# Patient Record
Sex: Female | Born: 1945 | Race: White | Hispanic: No | Marital: Married | Smoking: Former smoker
Health system: Southern US, Community
[De-identification: ages and names within clinical notes are randomized; demographics above are authoritative.]

## PROBLEM LIST (undated history)

## (undated) DIAGNOSIS — E119 Type 2 diabetes mellitus without complications: Secondary | ICD-10-CM

## (undated) DIAGNOSIS — J40 Bronchitis, not specified as acute or chronic: Secondary | ICD-10-CM

## (undated) DIAGNOSIS — I10 Essential (primary) hypertension: Secondary | ICD-10-CM

## (undated) DIAGNOSIS — E785 Hyperlipidemia, unspecified: Secondary | ICD-10-CM

## (undated) DIAGNOSIS — C50919 Malignant neoplasm of unspecified site of unspecified female breast: Secondary | ICD-10-CM

## (undated) HISTORY — DX: Type 2 diabetes mellitus without complications: E11.9

## (undated) HISTORY — DX: Essential (primary) hypertension: I10

## (undated) HISTORY — DX: Malignant neoplasm of unspecified site of unspecified female breast: C50.919

## (undated) HISTORY — PX: ABDOMINAL HYSTERECTOMY: SHX81

## (undated) HISTORY — DX: Bronchitis, not specified as acute or chronic: J40

## (undated) HISTORY — DX: Hyperlipidemia, unspecified: E78.5

## (undated) HISTORY — PX: BREAST SURGERY: SHX581

---

## 1996-12-30 DIAGNOSIS — C50919 Malignant neoplasm of unspecified site of unspecified female breast: Secondary | ICD-10-CM

## 1996-12-30 HISTORY — DX: Malignant neoplasm of unspecified site of unspecified female breast: C50.919

## 2002-03-10 ENCOUNTER — Ambulatory Visit (HOSPITAL_COMMUNITY): Admission: RE | Admit: 2002-03-10 | Discharge: 2002-03-10 | Payer: Self-pay | Admitting: Family Medicine

## 2002-03-10 ENCOUNTER — Encounter: Payer: Self-pay | Admitting: Family Medicine

## 2002-12-30 HISTORY — PX: COLONOSCOPY: SHX174

## 2003-03-15 ENCOUNTER — Encounter: Payer: Self-pay | Admitting: *Deleted

## 2003-03-15 ENCOUNTER — Ambulatory Visit (HOSPITAL_COMMUNITY): Admission: RE | Admit: 2003-03-15 | Discharge: 2003-03-15 | Payer: Self-pay | Admitting: *Deleted

## 2003-04-04 ENCOUNTER — Ambulatory Visit (HOSPITAL_COMMUNITY): Admission: RE | Admit: 2003-04-04 | Discharge: 2003-04-04 | Payer: Self-pay | Admitting: Internal Medicine

## 2003-12-31 HISTORY — PX: COLON BIOPSY: SHX1369

## 2004-03-21 ENCOUNTER — Ambulatory Visit (HOSPITAL_COMMUNITY): Admission: RE | Admit: 2004-03-21 | Discharge: 2004-03-21 | Payer: Self-pay | Admitting: Family Medicine

## 2004-05-07 ENCOUNTER — Ambulatory Visit (HOSPITAL_COMMUNITY): Admission: RE | Admit: 2004-05-07 | Discharge: 2004-05-07 | Payer: Self-pay | Admitting: Internal Medicine

## 2005-03-25 ENCOUNTER — Ambulatory Visit (HOSPITAL_COMMUNITY): Admission: RE | Admit: 2005-03-25 | Discharge: 2005-03-25 | Payer: Self-pay | Admitting: Family Medicine

## 2006-03-27 ENCOUNTER — Ambulatory Visit (HOSPITAL_COMMUNITY): Admission: RE | Admit: 2006-03-27 | Discharge: 2006-03-27 | Payer: Self-pay | Admitting: Family Medicine

## 2007-03-31 ENCOUNTER — Ambulatory Visit (HOSPITAL_COMMUNITY): Admission: RE | Admit: 2007-03-31 | Discharge: 2007-03-31 | Payer: Self-pay | Admitting: Family Medicine

## 2008-04-05 ENCOUNTER — Ambulatory Visit (HOSPITAL_COMMUNITY): Admission: RE | Admit: 2008-04-05 | Discharge: 2008-04-05 | Payer: Self-pay | Admitting: Family Medicine

## 2009-04-07 ENCOUNTER — Ambulatory Visit (HOSPITAL_COMMUNITY): Admission: RE | Admit: 2009-04-07 | Discharge: 2009-04-07 | Payer: Self-pay | Admitting: Internal Medicine

## 2009-10-02 ENCOUNTER — Ambulatory Visit (HOSPITAL_COMMUNITY): Admission: RE | Admit: 2009-10-02 | Discharge: 2009-10-02 | Payer: Self-pay | Admitting: Family Medicine

## 2010-04-09 ENCOUNTER — Ambulatory Visit (HOSPITAL_COMMUNITY): Admission: RE | Admit: 2010-04-09 | Discharge: 2010-04-09 | Payer: Self-pay | Admitting: Family Medicine

## 2011-03-04 ENCOUNTER — Other Ambulatory Visit (HOSPITAL_COMMUNITY): Payer: Self-pay | Admitting: Internal Medicine

## 2011-03-04 DIAGNOSIS — Z139 Encounter for screening, unspecified: Secondary | ICD-10-CM

## 2011-04-12 ENCOUNTER — Ambulatory Visit (HOSPITAL_COMMUNITY)
Admission: RE | Admit: 2011-04-12 | Discharge: 2011-04-12 | Disposition: A | Discharge status: Home / Self Care | Payer: Medicare Other | Source: Ambulatory Visit | Attending: Internal Medicine | Admitting: Internal Medicine

## 2011-04-12 DIAGNOSIS — Z139 Encounter for screening, unspecified: Secondary | ICD-10-CM

## 2011-04-12 DIAGNOSIS — Z1231 Encounter for screening mammogram for malignant neoplasm of breast: Secondary | ICD-10-CM | POA: Insufficient documentation

## 2011-04-15 ENCOUNTER — Other Ambulatory Visit: Payer: Self-pay | Admitting: Internal Medicine

## 2011-04-15 DIAGNOSIS — R928 Other abnormal and inconclusive findings on diagnostic imaging of breast: Secondary | ICD-10-CM

## 2011-04-17 ENCOUNTER — Ambulatory Visit (HOSPITAL_COMMUNITY)
Admission: RE | Admit: 2011-04-17 | Discharge: 2011-04-17 | Disposition: A | Discharge status: Home / Self Care | Payer: Medicare Other | Source: Ambulatory Visit | Attending: Internal Medicine | Admitting: Internal Medicine

## 2011-04-17 ENCOUNTER — Other Ambulatory Visit: Payer: Self-pay | Admitting: Internal Medicine

## 2011-04-17 DIAGNOSIS — R928 Other abnormal and inconclusive findings on diagnostic imaging of breast: Secondary | ICD-10-CM | POA: Insufficient documentation

## 2011-05-17 NOTE — Op Note (Signed)
NAME:  Kelly Gay, Kelly Gay                        ACCOUNT NO.:  [id number]   MEDICAL RECORD NO.:  [id number]                   PATIENT TYPE:  AMB   LOCATION:  DAY                                  FACILITY:  [facility]   PHYSICIAN:  R. Roetta Sessions, M.D.              DATE OF BIRTH:  [birth year]   DATE OF PROCEDURE:  DATE OF DISCHARGE:                                 OPERATIVE REPORT   PROCEDURE:  Screening colonoscopy.   ENDOSCOPIST:  Gerrit Friends. Rourk, M.D.   INDICATIONS FOR PROCEDURE:  The patient is a 65 year old lady devoid of any  lower GI tract symptoms. She was seen at the courtesy of [facility] for screening colonoscopy.  No family history of  colorectal neoplasia.  She has never had her lower GI tract imaged.  Please  see my handwritten H&P for more information.   MONITORING:  O2 saturation, blood pressure, pulse and respirations were  monitored throughout the entire procedure.  Conscious sedation: Versed 4 mg IV, Demerol 75 mg IV in divided doses.   INSTRUMENT:  Olympus video chip adult colonoscope.   FINDINGS:  Digital rectal exam revealed no abnormalities.   ENDOSCOPIC FINDINGS:  The prep was good.   RECTUM:  Examination of the rectal mucosa including the retroflex view of  the anal verge revealed a 5-mm polyp 3 cm from the anal verge.  The  remainder of the rectum appeared normal.   COLON:  The colonic mucosa was surveyed from the rectosigmoid junction  through the left transverse and right colon to the area of the appendiceal  orifice, ileocecal valve, and cecum.  These structures were well seen and  photographed for the record.  The patient was noted to have multiple small  diminutive polyps at the rectosigmoid which was destroyed with the tip of  the snare cautery unit.  At 45 cm there was a 5 mm polyp.  The remainder of  the colonic mucosa to the cecum appeared normal.   From the level of the cecum and ileocecal valve the scope was  slowly and  cautiously withdrawn.  All previously mentioned mucosal surfaces were again  seen; no abnormalities were observed.  The polyp at 45 cm was removed with  the snare.  Likewise the polyp in the rectum was also removed with the  snare.  The diminutive polyps of the rectosigmoid were destroyed with the  tip of the snare on the way out.   The patient tolerated the procedure well and was reacted in endoscopy.   IMPRESSION:  1. Left colon polys as described above, treated and/or removed as described     above.  2. The remainder of the rectum and colon appeared normal.   RECOMMENDATIONS:  1. No aspirin or arthritis medications for 10 days.  2. Follow up on path.  3. Further recommendations to follow.  Jonathon Bellows, M.D.    RMR/MEDQ  D:  04/04/2003  T:  04/04/2003  Job:  [id number]   cc:   [facility]

## 2011-05-17 NOTE — Op Note (Signed)
NAME:  Kelly Gay, Kelly Gay                        ACCOUNT NO.:  [id number]   MEDICAL RECORD NO.:  [id number]                   PATIENT TYPE:  AMB   LOCATION:  DAY                                  FACILITY:  [facility]   PHYSICIAN:  R. Roetta Sessions, M.D.              DATE OF BIRTH:  11-09-1946   DATE OF PROCEDURE:  05/07/2004  DATE OF DISCHARGE:                                 OPERATIVE REPORT   PROCEDURE:  Surveillance colonoscopy.   INDICATIONS FOR PROCEDURE:  The patient is a 65 year old lady who underwent  a colonoscopy one year ago.  She had polyps in the rectum and sigmoid.  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  The rectal  polyp was inflammatory.  She is here for early surveillance.  She is devoid  of any lower GI tract symptoms.  She has really done well.  Colonoscopy has  been discussed with the patient at length.  The potential risks, benefits,  and alternatives have been reviewed.  She is agreeable.  Please see my  handwritten H&P for more information.   PROCEDURE:  O2 saturation, blood pressure, pulses, and respirations were  monitored throughout the entirety of the procedure.  Conscious sedation was  with Versed 5 mg IV, Demerol 100 mg IV in divided doses.  The instrument  used was the Olympus video chip system.   FINDINGS:  Digital rectal examination revealed no abnormalities.   ENDOSCOPIC FINDINGS:  The prep was good.   Rectum:  Examination of the rectal mucosa including retroflex view of the  anal verge revealed only minimal internal hemorrhoids.   Colon:  The colonic mucosa was surveyed from the rectosigmoid junction  through the left, transverse, right colon to the area of the appendiceal  orifice, ileocecal valve, and cecum.  These structures were well-seen and  photographed for the record.  From this level, the scope was slowly  withdrawn.  All previously mentioned mucosal surfaces were again seen.  The  colonic mucosa appeared normal.  The  patient tolerated the procedure well  and was reactive in endoscopy.   IMPRESSION:  1. Internal hemorrhoids.  Otherwise normal rectum  2. Normal colon.   RECOMMENDATIONS:  Repeat colonoscopy in three years.      ___________________________________________                                            Jonathon Bellows, M.D.   RMR/MEDQ  D:  05/07/2004  T:  05/07/2004  Job:  [id number]   cc:   Alfonso Patten, P.A.C.  [facility]  [place], [place]

## 2011-09-10 ENCOUNTER — Other Ambulatory Visit (HOSPITAL_COMMUNITY): Payer: Self-pay | Admitting: Family Medicine

## 2011-09-10 DIAGNOSIS — R922 Inconclusive mammogram: Secondary | ICD-10-CM

## 2011-10-23 ENCOUNTER — Encounter (HOSPITAL_COMMUNITY): Payer: Medicare Other

## 2011-10-30 ENCOUNTER — Ambulatory Visit (HOSPITAL_COMMUNITY)
Admission: RE | Admit: 2011-10-30 | Discharge: 2011-10-30 | Disposition: A | Discharge status: Home / Self Care | Payer: Medicare Other | Source: Ambulatory Visit | Attending: Family Medicine | Admitting: Family Medicine

## 2011-10-30 DIAGNOSIS — R922 Inconclusive mammogram: Secondary | ICD-10-CM

## 2011-10-30 DIAGNOSIS — N6489 Other specified disorders of breast: Secondary | ICD-10-CM | POA: Insufficient documentation

## 2012-03-24 ENCOUNTER — Other Ambulatory Visit (HOSPITAL_COMMUNITY): Payer: Self-pay | Admitting: Family Medicine

## 2012-03-24 DIAGNOSIS — C50919 Malignant neoplasm of unspecified site of unspecified female breast: Secondary | ICD-10-CM

## 2012-03-24 DIAGNOSIS — Z139 Encounter for screening, unspecified: Secondary | ICD-10-CM

## 2012-04-14 ENCOUNTER — Ambulatory Visit (HOSPITAL_COMMUNITY)
Admission: RE | Admit: 2012-04-14 | Discharge: 2012-04-14 | Disposition: A | Discharge status: Home / Self Care | Payer: Medicare Other | Source: Ambulatory Visit | Attending: Family Medicine | Admitting: Family Medicine

## 2012-04-14 DIAGNOSIS — Z1231 Encounter for screening mammogram for malignant neoplasm of breast: Secondary | ICD-10-CM | POA: Insufficient documentation

## 2012-04-14 DIAGNOSIS — Z139 Encounter for screening, unspecified: Secondary | ICD-10-CM

## 2012-04-15 ENCOUNTER — Encounter (HOSPITAL_COMMUNITY): Payer: Medicare Other

## 2013-03-15 ENCOUNTER — Other Ambulatory Visit (HOSPITAL_COMMUNITY): Payer: Self-pay | Admitting: Family Medicine

## 2013-03-15 DIAGNOSIS — Z139 Encounter for screening, unspecified: Secondary | ICD-10-CM

## 2013-04-06 ENCOUNTER — Emergency Department (HOSPITAL_COMMUNITY): Payer: Medicare Other

## 2013-04-06 ENCOUNTER — Encounter (HOSPITAL_COMMUNITY): Payer: Self-pay | Admitting: *Deleted

## 2013-04-06 ENCOUNTER — Emergency Department (HOSPITAL_COMMUNITY)
Admission: EM | Admit: 2013-04-06 | Discharge: 2013-04-06 | Disposition: A | Discharge status: Home / Self Care | Payer: Medicare Other | Attending: Emergency Medicine | Admitting: Emergency Medicine

## 2013-04-06 DIAGNOSIS — Y9289 Other specified places as the place of occurrence of the external cause: Secondary | ICD-10-CM | POA: Insufficient documentation

## 2013-04-06 DIAGNOSIS — S0990XA Unspecified injury of head, initial encounter: Secondary | ICD-10-CM | POA: Insufficient documentation

## 2013-04-06 DIAGNOSIS — S42201A Unspecified fracture of upper end of right humerus, initial encounter for closed fracture: Secondary | ICD-10-CM

## 2013-04-06 DIAGNOSIS — S59909A Unspecified injury of unspecified elbow, initial encounter: Secondary | ICD-10-CM | POA: Insufficient documentation

## 2013-04-06 DIAGNOSIS — W010XXA Fall on same level from slipping, tripping and stumbling without subsequent striking against object, initial encounter: Secondary | ICD-10-CM | POA: Insufficient documentation

## 2013-04-06 DIAGNOSIS — F17200 Nicotine dependence, unspecified, uncomplicated: Secondary | ICD-10-CM | POA: Insufficient documentation

## 2013-04-06 DIAGNOSIS — S6990XA Unspecified injury of unspecified wrist, hand and finger(s), initial encounter: Secondary | ICD-10-CM | POA: Insufficient documentation

## 2013-04-06 DIAGNOSIS — S42209A Unspecified fracture of upper end of unspecified humerus, initial encounter for closed fracture: Secondary | ICD-10-CM | POA: Insufficient documentation

## 2013-04-06 DIAGNOSIS — S59919A Unspecified injury of unspecified forearm, initial encounter: Secondary | ICD-10-CM | POA: Insufficient documentation

## 2013-04-06 DIAGNOSIS — Y9389 Activity, other specified: Secondary | ICD-10-CM | POA: Insufficient documentation

## 2013-04-06 MED ORDER — ONDANSETRON 8 MG PO TBDP
8.0000 mg | ORAL_TABLET | Freq: Once | ORAL | Status: AC
  Administered 2013-04-06: 8 mg via ORAL
  Filled 2013-04-06: qty 1

## 2013-04-06 MED ORDER — OXYCODONE-ACETAMINOPHEN 5-325 MG PO TABS: 1.0000 | ORAL_TABLET | ORAL | Status: DC | PRN

## 2013-04-06 MED ORDER — MORPHINE SULFATE 4 MG/ML IJ SOLN
4.0000 mg | Freq: Once | INTRAMUSCULAR | Status: AC
  Administered 2013-04-06: 4 mg via INTRAMUSCULAR
  Filled 2013-04-06: qty 1

## 2013-04-06 NOTE — ED Provider Notes (Signed)
History  This chart was scribed for Joya Gaskins, MD, by Candelaria Stagers, ED Scribe. This patient was seen in room [facility] and the patient's care was started at 5:51 PM   CSN: [id number]  Arrival date & time 04/06/13  1637   First MD Initiated Contact with Patient 04/06/13 1700      Chief Complaint  Patient presents with  . Fall    The history is provided by the patient. No language interpreter was used.   Kelly Gay is a 67 y.o. female who presents to the Emergency Department complaining of right shoulder pain and right elbow pain after tripping on a dog leash and falling backwards landing on her right shoulder earlier today.  Pt reports she hit her head but denies LOC.  She denies neck pain, back pain, headache, chest pain, or abdominal pain.  Nothing seems to make the sx better or worse.  Pt was ambulatory after the fall.  Pt has no previous injury or surgeries to the right arm.         PMH - none  Past Surgical History  Procedure Laterality Date  . Upper gastrointestinal endoscopy    . Abdominal hysterectomy    . Breast surgery      History reviewed. No pertinent family history.  History  Substance Use Topics  . Smoking status: Current Every Day Smoker  . Smokeless tobacco: Not on file  . Alcohol Use: No    OB History   Grav Para Term Preterm Abortions TAB SAB Ect Mult Living                  Review of Systems  HENT: Negative for neck pain.   Musculoskeletal: Positive for arthralgias (right shoulder and elbow pain). Negative for back pain.  Neurological: Negative for syncope.  All other systems reviewed and are negative.    Allergies  Review of patient's allergies indicates no active allergies.  Home Medications  No current outpatient prescriptions on file.  BP 151/70  Pulse 90  Temp(Src) 98.1 F (36.7 C) (Oral)  Resp 22  Ht 5\' 5"  (1.651 m)  Wt 119 lb (53.978 kg)  BMI 19.8 kg/m2  SpO2 99%  Physical Exam CONSTITUTIONAL: Well  developed/well nourished HEAD: Normocephalic/atraumatic EYES: EOMI/PERRL ENMT: Mucous membranes moist, No evidence of facial/nasal trauma NECK: supple no meningeal signs SPINE:entire spine nontender, No bruising/crepitance/stepoffs noted to spine CV: S1/S2 noted, no murmurs/rubs/gallops noted LUNGS: Lungs are clear to auscultation bilaterally, no apparent distress ABDOMEN: soft, nontender, no rebound or guarding GU:no cva tenderness NEURO: Pt is awake/alert, moves all extremitiesx4 EXTREMITIES: pulses normal, Tender to palpation of right elbow and right shoulder, abrasion to the right elbow but no laceration noted and no bone exposed.  No tenderness to right wrist/hand. She can range right elbow. Distal motor is intact on right UE.  Distal pulses are intact on right UE All other extremities/joints palpated/ranged and nontender SKIN: warm, color normal PSYCH: no abnormalities of mood noted  ED Course  Procedures   DIAGNOSTIC STUDIES: Oxygen Saturation is 99% on room air, normal by my interpretation.    COORDINATION OF CARE:  5:57 PM Discussed course of care with pt which includes right arm immobilizer, pain medication, and follow up with orthopaedist.  Pt understands and agrees.  Pt stable for d/c and will need to call for f/u with ortho tomorrow  Labs Reviewed - No data to display Dg Shoulder Right  04/06/2013  *RADIOLOGY REPORT*  Clinical Data: Fall.  Right shoulder pain.  RIGHT SHOULDER - 2+ VIEW  Comparison: Right humerus  Findings: There is a comminuted fracture of the right humeral head and neck.  The glenohumeral joint is located. No additional fractures are present.  The right hemithorax is clear.  IMPRESSION: Comminuted fracture of the right humeral head and neck without dislocation.   Original Report Authenticated By: Marin Roberts, M.D.    Dg Humerus Right  04/06/2013  *RADIOLOGY REPORT*  Clinical Data: Fall.  The right shoulder pain.  RIGHT HUMERUS - 2+ VIEW   Comparison: Shoulder films of the same day.  Findings: A comminuted right humeral head and surgical neck fracture is foreshortened.  The glenohumeral joint is intact.  The distal humerus is unremarkable.  IMPRESSION:  1.  Comminuted right humeral head and neck fracture without dislocation.   Original Report Authenticated By: Marin Roberts, M.D.        MDM  Nursing notes including past medical history and social history reviewed and considered in documentation xrays reviewed and considered      I personally performed the services described in this documentation, which was scribed in my presence. The recorded information has been reviewed and is accurate.         Joya Gaskins, MD 04/06/13 1901

## 2013-04-06 NOTE — ED Notes (Addendum)
Larey Seat when caught between neighbors dog  And chain, fell backwards and injured rt upper arm.  Good radial pulse, pt is attempting to remove her rings

## 2013-04-07 ENCOUNTER — Telehealth: Payer: Self-pay | Admitting: Orthopedic Surgery

## 2013-04-07 NOTE — Telephone Encounter (Signed)
Schedule when we have an appt time

## 2013-04-07 NOTE — Telephone Encounter (Signed)
Please review xr and report for Kelly Gay right shoulder fracture, went to [facility] ER 04/06/13 and advise when to schedule.  Thanks

## 2013-04-07 NOTE — Telephone Encounter (Signed)
Spoke with patient, she has an appointment with  Dr. Hilda Lias Thursday, 04/08/13.

## 2013-04-19 ENCOUNTER — Ambulatory Visit (HOSPITAL_COMMUNITY): Payer: Medicare Other

## 2013-05-21 ENCOUNTER — Other Ambulatory Visit: Payer: Self-pay | Admitting: Orthopedic Surgery

## 2013-05-21 ENCOUNTER — Ambulatory Visit
Admission: RE | Admit: 2013-05-21 | Discharge: 2013-05-21 | Disposition: A | Discharge status: Home / Self Care | Payer: Medicare Other | Source: Ambulatory Visit | Attending: Orthopedic Surgery | Admitting: Orthopedic Surgery

## 2013-05-21 DIAGNOSIS — M25511 Pain in right shoulder: Secondary | ICD-10-CM

## 2013-09-02 ENCOUNTER — Ambulatory Visit (HOSPITAL_COMMUNITY): Payer: Medicare Other

## 2013-09-03 ENCOUNTER — Ambulatory Visit (HOSPITAL_COMMUNITY)
Admission: RE | Admit: 2013-09-03 | Discharge: 2013-09-03 | Disposition: A | Discharge status: Home / Self Care | Payer: Medicare Other | Source: Ambulatory Visit | Attending: Family Medicine | Admitting: Family Medicine

## 2013-09-03 DIAGNOSIS — Z139 Encounter for screening, unspecified: Secondary | ICD-10-CM

## 2013-09-03 DIAGNOSIS — Z1231 Encounter for screening mammogram for malignant neoplasm of breast: Secondary | ICD-10-CM | POA: Insufficient documentation

## 2014-05-24 ENCOUNTER — Telehealth: Payer: Self-pay

## 2014-05-24 NOTE — Telephone Encounter (Signed)
Pt was referred by Chip Boer, PA for screening colonoscopy. She was last done according to our records on 05/07/2004 and Dr. Gala Romney recommended the next in 3 years.   She is scheduled with Neil Crouch, PA on 06/22/2014 at 10:30 AM for OV.

## 2014-06-22 ENCOUNTER — Encounter: Payer: Self-pay | Admitting: Gastroenterology

## 2014-06-22 ENCOUNTER — Ambulatory Visit (INDEPENDENT_AMBULATORY_CARE_PROVIDER_SITE_OTHER): Payer: Medicare Other | Admitting: Gastroenterology

## 2014-06-22 ENCOUNTER — Encounter (INDEPENDENT_AMBULATORY_CARE_PROVIDER_SITE_OTHER): Payer: Self-pay

## 2014-06-22 VITALS — BP 134/75 | HR 90 | Temp 97.9°F | Resp 18 | Ht 64.0 in | Wt 121.0 lb

## 2014-06-22 DIAGNOSIS — D649 Anemia, unspecified: Secondary | ICD-10-CM

## 2014-06-22 DIAGNOSIS — K219 Gastro-esophageal reflux disease without esophagitis: Secondary | ICD-10-CM

## 2014-06-22 DIAGNOSIS — Z86004 Personal history of in-situ neoplasm of other and unspecified digestive organs: Secondary | ICD-10-CM | POA: Insufficient documentation

## 2014-06-22 DIAGNOSIS — Z87898 Personal history of other specified conditions: Secondary | ICD-10-CM

## 2014-06-22 MED ORDER — PEG 3350-KCL-NA BICARB-NACL 420 G PO SOLR: 4000.0000 mL | ORAL | Status: DC

## 2014-06-22 NOTE — Assessment & Plan Note (Addendum)
68 y/o female with normocytic anemia, hemoccult status unknown. 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 She is well-overdue for colonoscopy. She has chronic GERD without prior EGD. Recommend TCS/EGD to evaluate anemia, rule out complicated GERD, h/o carcinoma in situ of sigmoid colon.  I have discussed the risks, alternatives, benefits with regards to but not limited to the risk of reaction to medication, bleeding, infection, perforation and the patient is agreeable to proceed. Written consent to be obtained.  Based on her history, her children should have their first colonoscopies at age 67.

## 2014-06-22 NOTE — Patient Instructions (Signed)
1. Colonoscopy and upper endoscopy with Dr. Gala Romney. See separate instructions.

## 2014-06-22 NOTE — Progress Notes (Signed)
Primary Care Physician:  Geroge Baseman  Primary Gastroenterologist:  Garfield Cornea, MD   Chief Complaint  Patient presents with  . Colon Cancer Screening    HPI:  Kelly Gay is a 68 y.o. female here at the request of Elin Claggett, PA-C for further evaluation of anemia/colonoscopy. 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 Last colonoscopy in 2003, normal. Recently labs showed Hgb of 10.8. Started empirically on MVI with iron. Hemoccult status unknown. BM 1-2 solid stool. No melena, brbpr. No abdominal pain. Lots of noise in the stool. Typical heartburn well-controlled on Prilosec. On PPI for years. Patient has good appetite but does not eat a lot at one time. No prior EGD per patient.  Current Outpatient Prescriptions  Medication Sig Dispense Refill  . citalopram (CELEXA) 10 MG tablet Take 10 mg by mouth daily.      . fenofibrate 160 MG tablet Take 160 mg by mouth daily.      Marland Kitchen glipiZIDE (GLUCOTROL) 10 MG tablet Take 10 mg by mouth 2 (two) times daily.      . hydrochlorothiazide (HYDRODIURIL) 12.5 MG tablet Take 12.5 mg by mouth daily.      Marland Kitchen loratadine (CLARITIN) 10 MG tablet Take 10 mg by mouth daily.      Marland Kitchen losartan (COZAAR) 100 MG tablet Take 100 mg by mouth daily.      Marland Kitchen METFORMIN HCL PO Take 1 tablet by mouth 2 (two) times daily.      . Multiple Vitamins-Minerals (CENTRUM ULTRA WOMENS) TABS Take by mouth daily.      Marland Kitchen omeprazole (PRILOSEC) 10 MG capsule Take 10 mg by mouth daily.      Marland Kitchen QVAR 40 MCG/ACT inhaler        No current facility-administered medications for this visit.    Allergies as of 06/22/2014  . (No Known Allergies)    Past Medical History  Diagnosis Date  . Diabetes   . HTN (hypertension)   . Hyperlipidemia   . Breast cancer     1998, right   . Bronchitis     Past Surgical History  Procedure Laterality Date  . Abdominal hysterectomy    . Breast surgery      partial, right for cancer  . Colonoscopy   2004    Dr. Gala Romney: multiple rectosigmoid polyps, sigmoid polyp with carcinoma in situ  . Colon biopsy  2005     Dr. Gala Romney: normal    Family History  Problem Relation Age of Onset  . Colon cancer Neg Hx   . Throat cancer Brother   . Lung cancer Brother   . Cancer Brother     ?prostate    History   Social History  . Marital Status: Married    Spouse Name: N/A    Number of Children: 2  . Years of Education: N/A   Occupational History  . Not on file.   Social History Main Topics  . Smoking status: Current Every Day Smoker  . Smokeless tobacco: Not on file  . Alcohol Use: No  . Drug Use: No  . Sexual Activity: Yes    Birth Control/ Protection: Surgical   Other Topics Concern  . Not on file   Social History Narrative  . No narrative on file      ROS:  General: Negative for anorexia, weight loss, fever, chills, fatigue, weakness. Eyes: Negative for vision changes.  ENT: Negative for hoarseness, difficulty swallowing , nasal  congestion. CV: Negative for chest pain, angina, palpitations, dyspnea on exertion, peripheral edema.  Respiratory: Negative for dyspnea at rest, dyspnea on exertion, cough, sputum, wheezing.  GI: See history of present illness. GU:  Negative for dysuria, hematuria, urinary incontinence, urinary frequency, nocturnal urination.  MS: Negative for joint pain, low back pain.  Derm: Negative for rash or itching.  Neuro: Negative for weakness, abnormal sensation, seizure, frequent headaches, memory loss, confusion.  Psych: Negative for anxiety, depression, suicidal ideation, hallucinations.  Endo: Negative for unusual weight change.  Heme: Negative for bruising or bleeding. Allergy: Negative for rash or hives.    Physical Examination:  BP 134/75  Pulse 90  Temp(Src) 97.9 F (36.6 C) (Oral)  Resp 18  Ht 5\' 4"  (1.626 m)  Wt 121 lb (54.885 kg)  BMI 20.76 kg/m2   General: Well-nourished, well-developed in no acute distress.  Head:  Normocephalic, atraumatic.   Eyes: Conjunctiva pink, no icterus. Mouth: Oropharyngeal mucosa moist and pink , no lesions erythema or exudate. Neck: Supple without thyromegaly, masses, or lymphadenopathy.  Lungs: Clear to auscultation bilaterally.  Heart: Regular rate and rhythm, no murmurs rubs or gallops.  Abdomen: Bowel sounds are normal, nontender, nondistended, no hepatosplenomegaly or masses, no abdominal bruits or    hernia , no rebound or guarding.   Rectal: deferred Extremities: No lower extremity edema. No clubbing or deformities.  Neuro: Alert and oriented x 4 , grossly normal neurologically.  Skin: Warm and dry, no rash or jaundice.   Psych: Alert and cooperative, normal mood and affect.  Labs: Labs from 05/24/2014 White blood cell count 6200, hemoglobin 10.8, hematocrit 33.6, and MCV 92.9, platelets 297,000, hemoglobin A1c 6.4.  Imaging Studies: No results found.

## 2014-06-23 NOTE — Progress Notes (Signed)
cc'd to pcp 

## 2014-06-30 ENCOUNTER — Encounter (HOSPITAL_COMMUNITY): Payer: Self-pay | Admitting: Pharmacy Technician

## 2014-07-18 ENCOUNTER — Encounter (HOSPITAL_COMMUNITY)
Admission: RE | Disposition: A | Discharge status: Home / Self Care | Payer: Self-pay | Source: Ambulatory Visit | Attending: Internal Medicine

## 2014-07-18 ENCOUNTER — Ambulatory Visit (HOSPITAL_COMMUNITY)
Admission: RE | Admit: 2014-07-18 | Discharge: 2014-07-18 | Disposition: A | Discharge status: Home / Self Care | Payer: Medicare Other | Source: Ambulatory Visit | Attending: Internal Medicine | Admitting: Internal Medicine

## 2014-07-18 ENCOUNTER — Encounter (HOSPITAL_COMMUNITY): Payer: Self-pay

## 2014-07-18 DIAGNOSIS — Z809 Family history of malignant neoplasm, unspecified: Secondary | ICD-10-CM | POA: Insufficient documentation

## 2014-07-18 DIAGNOSIS — D126 Benign neoplasm of colon, unspecified: Secondary | ICD-10-CM | POA: Insufficient documentation

## 2014-07-18 DIAGNOSIS — K5730 Diverticulosis of large intestine without perforation or abscess without bleeding: Secondary | ICD-10-CM | POA: Diagnosis not present

## 2014-07-18 DIAGNOSIS — D131 Benign neoplasm of stomach: Secondary | ICD-10-CM | POA: Insufficient documentation

## 2014-07-18 DIAGNOSIS — Z86004 Personal history of in-situ neoplasm of other and unspecified digestive organs: Secondary | ICD-10-CM

## 2014-07-18 DIAGNOSIS — E785 Hyperlipidemia, unspecified: Secondary | ICD-10-CM | POA: Insufficient documentation

## 2014-07-18 DIAGNOSIS — K219 Gastro-esophageal reflux disease without esophagitis: Secondary | ICD-10-CM | POA: Insufficient documentation

## 2014-07-18 DIAGNOSIS — I10 Essential (primary) hypertension: Secondary | ICD-10-CM | POA: Insufficient documentation

## 2014-07-18 DIAGNOSIS — Z853 Personal history of malignant neoplasm of breast: Secondary | ICD-10-CM | POA: Insufficient documentation

## 2014-07-18 DIAGNOSIS — F17200 Nicotine dependence, unspecified, uncomplicated: Secondary | ICD-10-CM | POA: Diagnosis not present

## 2014-07-18 DIAGNOSIS — D649 Anemia, unspecified: Secondary | ICD-10-CM | POA: Insufficient documentation

## 2014-07-18 DIAGNOSIS — Z09 Encounter for follow-up examination after completed treatment for conditions other than malignant neoplasm: Secondary | ICD-10-CM | POA: Insufficient documentation

## 2014-07-18 DIAGNOSIS — Z79899 Other long term (current) drug therapy: Secondary | ICD-10-CM | POA: Diagnosis not present

## 2014-07-18 DIAGNOSIS — D175 Benign lipomatous neoplasm of intra-abdominal organs: Secondary | ICD-10-CM | POA: Diagnosis not present

## 2014-07-18 DIAGNOSIS — Z87898 Personal history of other specified conditions: Secondary | ICD-10-CM

## 2014-07-18 DIAGNOSIS — E118 Type 2 diabetes mellitus with unspecified complications: Secondary | ICD-10-CM | POA: Insufficient documentation

## 2014-07-18 HISTORY — PX: COLONOSCOPY: SHX5424

## 2014-07-18 HISTORY — PX: ESOPHAGOGASTRODUODENOSCOPY: SHX5428

## 2014-07-18 LAB — GLUCOSE, CAPILLARY: GLUCOSE-CAPILLARY: 176 mg/dL — AB (ref 70–99)

## 2014-07-18 SURGERY — COLONOSCOPY
Anesthesia: Moderate Sedation

## 2014-07-18 MED ORDER — SIMETHICONE 40 MG/0.6ML PO SUSP
ORAL | Status: DC | PRN
  Administered 2014-07-18: 10:00:00

## 2014-07-18 MED ORDER — SODIUM CHLORIDE 0.9 % IV SOLN
INTRAVENOUS | Status: DC
  Administered 2014-07-18: 09:00:00 via INTRAVENOUS

## 2014-07-18 MED ORDER — ONDANSETRON HCL 4 MG/2ML IJ SOLN
INTRAMUSCULAR | Status: DC | PRN
  Administered 2014-07-18: 4 mg via INTRAVENOUS

## 2014-07-18 MED ORDER — MEPERIDINE HCL 100 MG/ML IJ SOLN
INTRAMUSCULAR | Status: DC | PRN
  Administered 2014-07-18 (×2): 50 mg via INTRAVENOUS

## 2014-07-18 MED ORDER — MIDAZOLAM HCL 5 MG/5ML IJ SOLN
INTRAMUSCULAR | Status: AC
  Filled 2014-07-18: qty 10

## 2014-07-18 MED ORDER — LIDOCAINE VISCOUS 2 % MT SOLN
OROMUCOSAL | Status: DC | PRN
  Administered 2014-07-18: 4 mL via OROMUCOSAL

## 2014-07-18 MED ORDER — LIDOCAINE VISCOUS 2 % MT SOLN
OROMUCOSAL | Status: AC
  Filled 2014-07-18: qty 15

## 2014-07-18 MED ORDER — MIDAZOLAM HCL 5 MG/5ML IJ SOLN
INTRAMUSCULAR | Status: DC | PRN
  Administered 2014-07-18: 2 mg via INTRAVENOUS
  Administered 2014-07-18: 1 mg via INTRAVENOUS
  Administered 2014-07-18: 2 mg via INTRAVENOUS

## 2014-07-18 MED ORDER — ONDANSETRON HCL 4 MG/2ML IJ SOLN
INTRAMUSCULAR | Status: AC
  Filled 2014-07-18: qty 2

## 2014-07-18 MED ORDER — MEPERIDINE HCL 100 MG/ML IJ SOLN
INTRAMUSCULAR | Status: AC
  Filled 2014-07-18: qty 2

## 2014-07-18 NOTE — Op Note (Signed)
[facility] [address] [place], [address]   ENDOSCOPY PROCEDURE REPORT  PATIENT: Kelly, Gay  MR#: [id number] BIRTHDATE: 04-Nov-1946 , 67  yrs. old GENDER: Female ENDOSCOPIST: Bridgette Habermann, MD FACP [facility] REFERRED BY: PROCEDURE DATE:  07/18/2014 PROCEDURE:     EGD with biopsy  INDICATIONS:     Screening examination-long-standing GERD  INFORMED CONSENT:   The risks, benefits, limitations, alternatives and imponderables have been discussed.  The potential for biopsy, esophogeal dilation, etc. have also been reviewed.  Questions have been answered.  All parties agreeable.  Please see the history and physical in the medical record for more information.  MEDICATIONS:    Versed 4 mg IV and Demerol 100 mg IV in divided doses. Xylocaine gel orally. Zofran 4 mg IV  DESCRIPTION OF PROCEDURE:   The DJ-[id number] ([id number])  endoscope was introduced through the mouth and advanced to the second portion of the duodenum without difficulty or limitations.  The mucosal surfaces were surveyed very carefully during advancement of the scope and upon withdrawal.  Retroflexion view of the proximal stomach and esophagogastric junction was performed.      FINDINGS:   Normal esophagus. Stomach empty. Multiple 1-3 mm benign hyperplastic-appearing polyps in the fundus and body. Otherwise, the remainder of the gastric mucosa appeared normal. Patent pylorus. Normal first and second portion of the duodenum,.  THERAPEUTIC / DIAGNOSTIC MANEUVERS PERFORMED:  One of the gastric polyp was biopsied for histologic study   COMPLICATIONS:  None  IMPRESSION:    Benign-appearing gastric polyps-status post biopsy; otherwise, will EGD  RECOMMENDATIONS:  Followup on pathology. Colonoscopy report.    _______________________________ R. Garfield Cornea, MD FACP [facility] eSigned:  R. Garfield Cornea, MD FACP [facility] 07/18/2014 10:46 AM     CC:  PATIENT NAME:  Kelly, Gay MR#:  [id number]

## 2014-07-18 NOTE — Op Note (Signed)
[facility] [address] [place], [address]   COLONOSCOPY PROCEDURE REPORT  PATIENT: Kelly Gay, Kelly Gay  MR#:         [id number] BIRTHDATE: January 07, 1946 , 67  yrs. old GENDER: Female ENDOSCOPIST: R.  Garfield Cornea, MD FACP [facility] REFERRED BY:     Mikey Bussing PROCEDURE DATE:  07/18/2014 PROCEDURE:     Colonoscopy with polyp ablation, biopsy multiple snare polypectomies  INDICATIONS:  Distant history of high-grade adenomas; overdue for surveillance examination  INFORMED CONSENT:  The risks, benefits, alternatives and imponderables including but not limited to bleeding, perforation as well as the possibility of a missed lesion have been reviewed.  The potential for biopsy, lesion removal, etc. have also been discussed.  Questions have been answered.  All parties agreeable. Please see the history and physical in the medical record for more information.  MEDICATIONS: Versed 5 mg IV and Demerol 100 mg IV in divided doses.  DESCRIPTION OF PROCEDURE:  After a digital rectal exam was performed, the [id number] ([id number])  colonoscope was advanced from the anus through the rectum and colon to the area of the cecum, ileocecal valve and appendiceal orifice.  The cecum was deeply intubated.  These structures were well-seen and photographed for the record.  From the level of the cecum and ileocecal valve, the scope was slowly and cautiously withdrawn.  The mucosal surfaces were carefully surveyed utilizing scope tip deflection to facilitate fold flattening as needed.  The scope was pulled down into the rectum where a thorough examination including retroflexion was performed.    FINDINGS:  Adequate preparation. Normal rectum. Left-sided diverticula; 1.5 cm yellow submucosal nodule in the transverse segment (positive pillow sign) consistent with a lipoma. Patient had multiple colonic polyps in the cecum, hepatic flexure, descending and sigmoid segments.  The largest polyps  approximately was 8 mm at the hepatic flexure.  THERAPEUTIC / DIAGNOSTIC MANEUVERS PERFORMED:  Multiple hot and cold snare polypectomies performed. 1 cold biopsy the cecal polyp performed. Multiple polyp ablations performed with the tip of the hot snare loop.  COMPLICATIONS: none  CECAL WITHDRAWAL TIME:  25 minutes  IMPRESSION:  Colonic diverticulosis. Colonic lipoma. Multiple colonic polyps treated/removed as described above.  RECOMMENDATIONS: Followup on pathology. See EGD report.   _______________________________ eSigned:  R. Garfield Cornea, MD FACP [facility] 07/18/2014 11:26 AM   CC:    PATIENT NAME:  [name], [name] MR#: [id number]

## 2014-07-18 NOTE — Discharge Instructions (Addendum)
Colonoscopy Discharge Instructions  Read the instructions outlined below and refer to this sheet in the next few weeks. These discharge instructions provide you with general information on caring for yourself after you leave the hospital. Your doctor may also give you specific instructions. While your treatment has been planned according to the most current medical practices available, unavoidable complications occasionally occur. If you have any problems or questions after discharge, call Dr. Gala Romney at [phone]. ACTIVITY  You may resume your regular activity, but move at a slower pace for the next 24 hours.   Take frequent rest periods for the next 24 hours.   Walking will help get rid of the air and reduce the bloated feeling in your belly (abdomen).   No driving for 24 hours (because of the medicine (anesthesia) used during the test).    Do not sign any important legal documents or operate any machinery for 24 hours (because of the anesthesia used during the test).  NUTRITION  Drink plenty of fluids.   You may resume your normal diet as instructed by your doctor.   Begin with a light meal and progress to your normal diet. Heavy or fried foods are harder to digest and may make you feel sick to your stomach (nauseated).   Avoid alcoholic beverages for 24 hours or as instructed.  MEDICATIONS  You may resume your normal medications unless your doctor tells you otherwise.  WHAT YOU CAN EXPECT TODAY  Some feelings of bloating in the abdomen.   Passage of more gas than usual.   Spotting of blood in your stool or on the toilet paper.  IF YOU HAD POLYPS REMOVED DURING THE COLONOSCOPY:  No aspirin products for 7 days or as instructed.   No alcohol for 7 days or as instructed.   Eat a soft diet for the next 24 hours.  FINDING OUT THE RESULTS OF YOUR TEST Not all test results are available during your visit. If your test results are not back during the visit, make an appointment  with your caregiver to find out the results. Do not assume everything is normal if you have not heard from your caregiver or the medical facility. It is important for you to follow up on all of your test results.  SEEK IMMEDIATE MEDICAL ATTENTION IF:  You have more than a spotting of blood in your stool.   Your belly is swollen (abdominal distention).   You are nauseated or vomiting.   You have a temperature over 101.  You have abdominal pain or discomfort that is severe or gets worse throughout the day. EGD Discharge instructions Please read the instructions outlined below and refer to this sheet in the next few weeks. These discharge instructions provide you with general information on caring for yourself after you leave the hospital. Your doctor may also give you specific instructions. While your treatment has been planned according to the most current medical practices available, unavoidable complications occasionally occur. If you have any problems or questions after discharge, please call your doctor. ACTIVITY You may resume your regular activity but move at a slower pace for the next 24 hours.  Take frequent rest periods for the next 24 hours.  Walking will help expel (get rid of) the air and reduce the bloated feeling in your abdomen.  No driving for 24 hours (because of the anesthesia (medicine) used during the test).  You may shower.  Do not sign any important legal documents or operate any machinery for 24  hours (because of the anesthesia used during the test).  NUTRITION Drink plenty of fluids.  You may resume your normal diet.  Begin with a light meal and progress to your normal diet.  Avoid alcoholic beverages for 24 hours or as instructed by your caregiver.  MEDICATIONS You may resume your normal medications unless your caregiver tells you otherwise.  WHAT YOU CAN EXPECT TODAY You may experience abdominal discomfort such as a feeling of fullness or gas pains.   FOLLOW-UP Your doctor will discuss the results of your test with you.  SEEK IMMEDIATE MEDICAL ATTENTION IF ANY OF THE FOLLOWING OCCUR: Excessive nausea (feeling sick to your stomach) and/or vomiting.  Severe abdominal pain and distention (swelling).  Trouble swallowing.  Temperature over 101 F (37.8 C).  Rectal bleeding or vomiting of blood.      Further recommendations to follow pending review of pathology report  Colon Polyps Polyps are lumps of extra tissue growing inside the body. Polyps can grow in the large intestine (colon). Most colon polyps are noncancerous (benign). However, some colon polyps can become cancerous over time. Polyps that are larger than a pea may be harmful. To be safe, caregivers remove and test all polyps. CAUSES  Polyps form when mutations in the genes cause your cells to grow and divide even though no more tissue is needed. RISK FACTORS There are a number of risk factors that can increase your chances of getting colon polyps. They include: Being older than 50 years. Family history of colon polyps or colon cancer. Long-term colon diseases, such as colitis or Crohn disease. Being overweight. Smoking. Being inactive. Drinking too much alcohol. SYMPTOMS  Most small polyps do not cause symptoms. If symptoms are present, they may include: Blood in the stool. The stool may look dark red or black. Constipation or diarrhea that lasts longer than 1 week. DIAGNOSIS People often do not know they have polyps until their caregiver finds them during a regular checkup. Your caregiver can use 4 tests to check for polyps: Digital rectal exam. The caregiver wears gloves and feels inside the rectum. This test would find polyps only in the rectum. Barium enema. The caregiver puts a liquid called barium into your rectum before taking X-rays of your colon. Barium makes your colon look white. Polyps are dark, so they are easy to see in the X-ray pictures. Sigmoidoscopy. A  thin, flexible tube (sigmoidoscope) is placed into your rectum. The sigmoidoscope has a light and tiny camera in it. The caregiver uses the sigmoidoscope to look at the last third of your colon. Colonoscopy. This test is like sigmoidoscopy, but the caregiver looks at the entire colon. This is the most common method for finding and removing polyps. TREATMENT  Any polyps will be removed during a sigmoidoscopy or colonoscopy. The polyps are then tested for cancer. PREVENTION  To help lower your risk of getting more colon polyps: Eat plenty of fruits and vegetables. Avoid eating fatty foods. Do not smoke. Avoid drinking alcohol. Exercise every day. Lose weight if recommended by your caregiver. Eat plenty of calcium and folate. Foods that are rich in calcium include milk, cheese, and broccoli. Foods that are rich in folate include chickpeas, kidney beans, and spinach. HOME CARE INSTRUCTIONS Keep all follow-up appointments as directed by your caregiver. You may need periodic exams to check for polyps. SEEK MEDICAL CARE IF: You notice bleeding during a bowel movement. Document Released: 09/11/2004 Document Revised: 03/09/2012 Document Reviewed: 02/25/2012 [facility] Patient Information 2015 [place], [place]. This  information is not intended to replace advice given to you by your health care provider. Make sure you discuss any questions you have with your health care provider. ° °

## 2014-07-18 NOTE — H&P (View-Only) (Signed)
Primary Care Physician:  Geroge Baseman  Primary Gastroenterologist:  Garfield Cornea, MD   Chief Complaint  Patient presents with  . Colon Cancer Screening    HPI:  Kelly Gay is a 68 y.o. female here at the request of Elin Claggett, PA-C for further evaluation of anemia/colonoscopy. 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 Last colonoscopy in 2003, normal. Recently labs showed Hgb of 10.8. Started empirically on MVI with iron. Hemoccult status unknown. BM 1-2 solid stool. No melena, brbpr. No abdominal pain. Lots of noise in the stool. Typical heartburn well-controlled on Prilosec. On PPI for years. Patient has good appetite but does not eat a lot at one time. No prior EGD per patient.  Current Outpatient Prescriptions  Medication Sig Dispense Refill  . citalopram (CELEXA) 10 MG tablet Take 10 mg by mouth daily.      . fenofibrate 160 MG tablet Take 160 mg by mouth daily.      Marland Kitchen glipiZIDE (GLUCOTROL) 10 MG tablet Take 10 mg by mouth 2 (two) times daily.      . hydrochlorothiazide (HYDRODIURIL) 12.5 MG tablet Take 12.5 mg by mouth daily.      Marland Kitchen loratadine (CLARITIN) 10 MG tablet Take 10 mg by mouth daily.      Marland Kitchen losartan (COZAAR) 100 MG tablet Take 100 mg by mouth daily.      Marland Kitchen METFORMIN HCL PO Take 1 tablet by mouth 2 (two) times daily.      . Multiple Vitamins-Minerals (CENTRUM ULTRA WOMENS) TABS Take by mouth daily.      Marland Kitchen omeprazole (PRILOSEC) 10 MG capsule Take 10 mg by mouth daily.      Marland Kitchen QVAR 40 MCG/ACT inhaler        No current facility-administered medications for this visit.    Allergies as of 06/22/2014  . (No Known Allergies)    Past Medical History  Diagnosis Date  . Diabetes   . HTN (hypertension)   . Hyperlipidemia   . Breast cancer     1998, right   . Bronchitis     Past Surgical History  Procedure Laterality Date  . Abdominal hysterectomy    . Breast surgery      partial, right for cancer  . Colonoscopy   2004    Dr. Gala Romney: multiple rectosigmoid polyps, sigmoid polyp with carcinoma in situ  . Colon biopsy  2005     Dr. Gala Romney: normal    Family History  Problem Relation Age of Onset  . Colon cancer Neg Hx   . Throat cancer Brother   . Lung cancer Brother   . Cancer Brother     ?prostate    History   Social History  . Marital Status: Married    Spouse Name: N/A    Number of Children: 2  . Years of Education: N/A   Occupational History  . Not on file.   Social History Main Topics  . Smoking status: Current Every Day Smoker  . Smokeless tobacco: Not on file  . Alcohol Use: No  . Drug Use: No  . Sexual Activity: Yes    Birth Control/ Protection: Surgical   Other Topics Concern  . Not on file   Social History Narrative  . No narrative on file      ROS:  General: Negative for anorexia, weight loss, fever, chills, fatigue, weakness. Eyes: Negative for vision changes.  ENT: Negative for hoarseness, difficulty swallowing , nasal  congestion. CV: Negative for chest pain, angina, palpitations, dyspnea on exertion, peripheral edema.  Respiratory: Negative for dyspnea at rest, dyspnea on exertion, cough, sputum, wheezing.  GI: See history of present illness. GU:  Negative for dysuria, hematuria, urinary incontinence, urinary frequency, nocturnal urination.  MS: Negative for joint pain, low back pain.  Derm: Negative for rash or itching.  Neuro: Negative for weakness, abnormal sensation, seizure, frequent headaches, memory loss, confusion.  Psych: Negative for anxiety, depression, suicidal ideation, hallucinations.  Endo: Negative for unusual weight change.  Heme: Negative for bruising or bleeding. Allergy: Negative for rash or hives.    Physical Examination:  BP 134/75  Pulse 90  Temp(Src) 97.9 F (36.6 C) (Oral)  Resp 18  Ht 5\' 4"  (1.626 m)  Wt 121 lb (54.885 kg)  BMI 20.76 kg/m2   General: Well-nourished, well-developed in no acute distress.  Head:  Normocephalic, atraumatic.   Eyes: Conjunctiva pink, no icterus. Mouth: Oropharyngeal mucosa moist and pink , no lesions erythema or exudate. Neck: Supple without thyromegaly, masses, or lymphadenopathy.  Lungs: Clear to auscultation bilaterally.  Heart: Regular rate and rhythm, no murmurs rubs or gallops.  Abdomen: Bowel sounds are normal, nontender, nondistended, no hepatosplenomegaly or masses, no abdominal bruits or    hernia , no rebound or guarding.   Rectal: deferred Extremities: No lower extremity edema. No clubbing or deformities.  Neuro: Alert and oriented x 4 , grossly normal neurologically.  Skin: Warm and dry, no rash or jaundice.   Psych: Alert and cooperative, normal mood and affect.  Labs: Labs from 05/24/2014 White blood cell count 6200, hemoglobin 10.8, hematocrit 33.6, and MCV 92.9, platelets 297,000, hemoglobin A1c 6.4.  Imaging Studies: No results found.

## 2014-07-18 NOTE — Interval H&P Note (Signed)
History and Physical Interval Note:  07/18/2014 10:26 AM  Kelly Gay  has presented today for surgery, with the diagnosis of HISTORY OF COLON RECTAL CANCER, ANEMIA, CHRONIC GERD  The various methods of treatment have been discussed with the patient and family. After consideration of risks, benefits and other options for treatment, the patient has consented to  Procedure(s) with comments: COLONOSCOPY (N/A) - 10:00 ESOPHAGOGASTRODUODENOSCOPY (EGD) (N/A) as a surgical intervention .  The patient's history has been reviewed, patient examined, no change in status, stable for surgery.  I have reviewed the patient's chart and labs.  Questions were answered to the patient's satisfaction.     No change. EGD and colonoscopy per plan.  The risks, benefits, limitations, imponderables and alternatives regarding both EGD and colonoscopy have been reviewed with the patient. Questions have been answered. All parties agreeable.    Manus Rudd

## 2014-07-20 ENCOUNTER — Encounter: Payer: Self-pay | Admitting: Internal Medicine

## 2014-07-21 ENCOUNTER — Encounter (HOSPITAL_COMMUNITY): Payer: Self-pay | Admitting: Internal Medicine

## 2014-08-02 ENCOUNTER — Other Ambulatory Visit (HOSPITAL_COMMUNITY): Payer: Self-pay | Admitting: Family Medicine

## 2014-08-02 DIAGNOSIS — Z1231 Encounter for screening mammogram for malignant neoplasm of breast: Secondary | ICD-10-CM

## 2014-09-07 ENCOUNTER — Ambulatory Visit (HOSPITAL_COMMUNITY)
Admission: RE | Admit: 2014-09-07 | Discharge: 2014-09-07 | Disposition: A | Discharge status: Home / Self Care | Payer: Medicare Other | Source: Ambulatory Visit | Attending: Family Medicine | Admitting: Family Medicine

## 2014-09-07 DIAGNOSIS — Z1231 Encounter for screening mammogram for malignant neoplasm of breast: Secondary | ICD-10-CM | POA: Diagnosis present

## 2015-08-09 ENCOUNTER — Other Ambulatory Visit (HOSPITAL_COMMUNITY): Payer: Self-pay | Admitting: Physician Assistant

## 2015-08-09 ENCOUNTER — Other Ambulatory Visit: Payer: Self-pay | Admitting: Family Medicine

## 2015-08-09 DIAGNOSIS — Z1231 Encounter for screening mammogram for malignant neoplasm of breast: Secondary | ICD-10-CM

## 2015-09-11 ENCOUNTER — Ambulatory Visit (HOSPITAL_COMMUNITY)
Admission: RE | Admit: 2015-09-11 | Discharge: 2015-09-11 | Disposition: A | Discharge status: Home / Self Care | Payer: Medicare Other | Source: Ambulatory Visit | Attending: Physician Assistant | Admitting: Physician Assistant

## 2015-09-11 DIAGNOSIS — Z1231 Encounter for screening mammogram for malignant neoplasm of breast: Secondary | ICD-10-CM | POA: Diagnosis present

## 2016-08-02 ENCOUNTER — Other Ambulatory Visit (HOSPITAL_COMMUNITY): Payer: Self-pay | Admitting: Physician Assistant

## 2016-08-02 DIAGNOSIS — Z1231 Encounter for screening mammogram for malignant neoplasm of breast: Secondary | ICD-10-CM

## 2016-09-11 ENCOUNTER — Ambulatory Visit (HOSPITAL_COMMUNITY)
Admission: RE | Admit: 2016-09-11 | Discharge: 2016-09-11 | Disposition: A | Discharge status: Home / Self Care | Payer: Medicare Other | Source: Ambulatory Visit | Attending: Physician Assistant | Admitting: Physician Assistant

## 2016-09-11 DIAGNOSIS — Z1231 Encounter for screening mammogram for malignant neoplasm of breast: Secondary | ICD-10-CM | POA: Diagnosis not present

## 2017-03-05 ENCOUNTER — Telehealth: Payer: Self-pay

## 2017-03-05 NOTE — Telephone Encounter (Signed)
[phone] please call patient to set up tcs

## 2017-03-06 NOTE — Telephone Encounter (Signed)
Pt is not having any problems now. She is on our recall list for 06/2017. Dr.Rourk did her last in 06/2014.  She received a letter because PCP requested it. Pt is aware not really due until 06/2017 and she prefers to wait since she is not having any problems.

## 2017-03-06 NOTE — Telephone Encounter (Signed)
Sounds good

## 2017-05-13 ENCOUNTER — Telehealth: Payer: Self-pay

## 2017-05-13 NOTE — Telephone Encounter (Addendum)
Gastroenterology Pre-Procedure Review  Request Date: 05/13/2017 Requesting Physician: [facility]  PATIENT REVIEW QUESTIONS: The patient responded to the following health history questions as indicated:    Pt's last colonoscopy was 07/18/2014 by Dr. Gala Romney Next recommended for 3 years Hx of adenomatous polyps  1. Diabetes Melitis: YES 2. Joint replacements in the past 12 months: no 3. Major health problems in the past 3 months: no 4. Has an artificial valve or MVP: no 5. Has a defibrillator: no 6. Has been advised in past to take antibiotics in advance of a procedure like teeth cleaning: no 7. Family history of colon cancer: no  8. Alcohol Use: no 9. History of sleep apnea: no  10. History of coronary artery or other vascular stents placed within the last 12 months: no    MEDICATIONS & ALLERGIES:    Patient reports the following regarding taking any blood thinners:   Plavix? no Aspirin? no Coumadin? no Brilinta? no Xarelto? no Eliquis? no Pradaxa? no Savaysa? no Effient? no  Patient confirms/reports the following medications:  Current Outpatient Prescriptions  Medication Sig Dispense Refill  . cetirizine (ZYRTEC) 10 MG tablet Take 10 mg by mouth daily.    . citalopram (CELEXA) 10 MG tablet Take 10 mg by mouth daily.    Marland Kitchen glipiZIDE (GLUCOTROL) 10 MG tablet Take 10 mg by mouth 2 (two) times daily.    . hydrochlorothiazide (MICROZIDE) 12.5 MG capsule Take 12.5 mg by mouth daily.    Marland Kitchen losartan (COZAAR) 100 MG tablet Take 25 mg by mouth daily.     . metFORMIN (GLUCOPHAGE) 1000 MG tablet Take 1,000 mg by mouth 2 (two) times daily with a meal.    . Multiple Vitamins-Minerals (CENTRUM ULTRA WOMENS) TABS Take by mouth daily.    Marland Kitchen QVAR 40 MCG/ACT inhaler Inhale 1 puff into the lungs daily as needed. Shortness of breath/wheezing    . fenofibrate 160 MG tablet Take 160 mg by mouth daily.    Marland Kitchen loratadine (CLARITIN) 10 MG tablet Take 10 mg by mouth daily.    Marland Kitchen omeprazole  (PRILOSEC) 10 MG capsule Take 10 mg by mouth daily.    . polyethylene glycol-electrolytes (TRILYTE) 420 G solution Take 4,000 mLs by mouth as directed. 4000 mL 0   No current facility-administered medications for this visit.     Patient confirms/reports the following allergies:  No Known Allergies  No orders of the defined types were placed in this encounter.   AUTHORIZATION INFORMATION Primary Insurance:  ID #:   Group #:  Pre-Cert / Auth required: Pre-Cert / Auth #:   Secondary Insurance:  ID #:   Group #:  Pre-Cert / Auth required:  Pre-Cert / Auth #:  SCHEDULE INFORMATION: Procedure has been scheduled as follows:  Date:  Time:  Location:  This Gastroenterology Pre-Precedure Review Form is being routed to the following provider(s): R. Garfield Cornea, MD

## 2017-05-13 NOTE — Telephone Encounter (Signed)
Pt called and was triaged for her next colonoscopy. However, she needs to be scheduled after 07/18/2017. Dr. Gala Romney will be on vacation. I will call her when I get the August schedule.

## 2017-05-14 NOTE — Telephone Encounter (Signed)
Ok to schedule. DM meds: half the night before, none the morning of.

## 2017-05-14 NOTE — Telephone Encounter (Signed)
Pt would like to have an August appt. Noted to call her in July for August.

## 2017-06-05 ENCOUNTER — Encounter: Payer: Self-pay | Admitting: Internal Medicine

## 2017-06-23 ENCOUNTER — Other Ambulatory Visit: Payer: Self-pay

## 2017-06-23 ENCOUNTER — Telehealth: Payer: Self-pay | Admitting: Internal Medicine

## 2017-06-23 DIAGNOSIS — Z86010 Personal history of colonic polyps: Secondary | ICD-10-CM

## 2017-06-23 MED ORDER — NA SULFATE-K SULFATE-MG SULF 17.5-3.13-1.6 GM/177ML PO SOLN: 1.0000 | ORAL | 0 refills | Status: DC

## 2017-06-23 NOTE — Telephone Encounter (Signed)
PT has been scheduled for 08/08/2017 at 9:30 Am with Dr. Gala Romney.

## 2017-06-23 NOTE — Telephone Encounter (Signed)
See addendum to previous note. Pt has been scheduled.

## 2017-06-23 NOTE — Telephone Encounter (Signed)
[phone], DUE FOR 3 YR TCS, MAY NOT NEED OV DUE TO INSURANCE

## 2017-06-23 NOTE — Addendum Note (Signed)
Addended by: Everardo All on: 06/23/2017 03:41 PM   Modules accepted: Orders

## 2017-06-24 NOTE — Telephone Encounter (Signed)
Rx was sent to the pharmacy and instructions have been mailed to the pt.

## 2017-07-07 NOTE — Telephone Encounter (Signed)
No PA is needed for TCS

## 2017-07-31 ENCOUNTER — Other Ambulatory Visit (HOSPITAL_COMMUNITY): Payer: Self-pay | Admitting: Physician Assistant

## 2017-07-31 DIAGNOSIS — Z1231 Encounter for screening mammogram for malignant neoplasm of breast: Secondary | ICD-10-CM

## 2017-08-08 ENCOUNTER — Encounter (HOSPITAL_COMMUNITY): Payer: Self-pay | Admitting: *Deleted

## 2017-08-08 ENCOUNTER — Encounter (HOSPITAL_COMMUNITY)
Admission: RE | Disposition: A | Discharge status: Home / Self Care | Payer: Self-pay | Source: Ambulatory Visit | Attending: Internal Medicine

## 2017-08-08 ENCOUNTER — Ambulatory Visit (HOSPITAL_BASED_OUTPATIENT_CLINIC_OR_DEPARTMENT_OTHER)
Admission: RE | Admit: 2017-08-08 | Discharge: 2017-08-08 | Disposition: A | Discharge status: Home / Self Care | Payer: Medicare Other | Source: Ambulatory Visit | Attending: Internal Medicine | Admitting: Internal Medicine

## 2017-08-08 DIAGNOSIS — Z87891 Personal history of nicotine dependence: Secondary | ICD-10-CM | POA: Insufficient documentation

## 2017-08-08 DIAGNOSIS — Z86010 Personal history of colonic polyps: Secondary | ICD-10-CM | POA: Insufficient documentation

## 2017-08-08 DIAGNOSIS — Z8249 Family history of ischemic heart disease and other diseases of the circulatory system: Secondary | ICD-10-CM | POA: Insufficient documentation

## 2017-08-08 DIAGNOSIS — R0600 Dyspnea, unspecified: Secondary | ICD-10-CM | POA: Diagnosis not present

## 2017-08-08 DIAGNOSIS — Z90710 Acquired absence of both cervix and uterus: Secondary | ICD-10-CM

## 2017-08-08 DIAGNOSIS — D120 Benign neoplasm of cecum: Secondary | ICD-10-CM

## 2017-08-08 DIAGNOSIS — Z800 Family history of malignant neoplasm of digestive organs: Secondary | ICD-10-CM | POA: Insufficient documentation

## 2017-08-08 DIAGNOSIS — E119 Type 2 diabetes mellitus without complications: Secondary | ICD-10-CM

## 2017-08-08 DIAGNOSIS — I10 Essential (primary) hypertension: Secondary | ICD-10-CM | POA: Insufficient documentation

## 2017-08-08 DIAGNOSIS — Z1211 Encounter for screening for malignant neoplasm of colon: Secondary | ICD-10-CM | POA: Insufficient documentation

## 2017-08-08 DIAGNOSIS — Z853 Personal history of malignant neoplasm of breast: Secondary | ICD-10-CM | POA: Insufficient documentation

## 2017-08-08 DIAGNOSIS — K640 First degree hemorrhoids: Secondary | ICD-10-CM

## 2017-08-08 DIAGNOSIS — J90 Pleural effusion, not elsewhere classified: Secondary | ICD-10-CM | POA: Diagnosis not present

## 2017-08-08 DIAGNOSIS — Z7984 Long term (current) use of oral hypoglycemic drugs: Secondary | ICD-10-CM

## 2017-08-08 DIAGNOSIS — Z79899 Other long term (current) drug therapy: Secondary | ICD-10-CM

## 2017-08-08 DIAGNOSIS — E785 Hyperlipidemia, unspecified: Secondary | ICD-10-CM | POA: Insufficient documentation

## 2017-08-08 DIAGNOSIS — Z801 Family history of malignant neoplasm of trachea, bronchus and lung: Secondary | ICD-10-CM | POA: Insufficient documentation

## 2017-08-08 DIAGNOSIS — Z825 Family history of asthma and other chronic lower respiratory diseases: Secondary | ICD-10-CM

## 2017-08-08 DIAGNOSIS — D125 Benign neoplasm of sigmoid colon: Secondary | ICD-10-CM | POA: Insufficient documentation

## 2017-08-08 HISTORY — PX: COLONOSCOPY: SHX5424

## 2017-08-08 LAB — GLUCOSE, CAPILLARY: GLUCOSE-CAPILLARY: 146 mg/dL — AB (ref 65–99)

## 2017-08-08 SURGERY — COLONOSCOPY
Anesthesia: Moderate Sedation

## 2017-08-08 MED ORDER — MEPERIDINE HCL 100 MG/ML IJ SOLN
INTRAMUSCULAR | Status: DC | PRN
  Administered 2017-08-08: 25 mg
  Administered 2017-08-08: 50 mg
  Administered 2017-08-08: 25 mg

## 2017-08-08 MED ORDER — ONDANSETRON HCL 4 MG/2ML IJ SOLN
INTRAMUSCULAR | Status: DC | PRN
  Administered 2017-08-08: 4 mg via INTRAVENOUS

## 2017-08-08 MED ORDER — MIDAZOLAM HCL 5 MG/5ML IJ SOLN
INTRAMUSCULAR | Status: AC
  Filled 2017-08-08: qty 10

## 2017-08-08 MED ORDER — MEPERIDINE HCL 100 MG/ML IJ SOLN
INTRAMUSCULAR | Status: AC
  Filled 2017-08-08: qty 2

## 2017-08-08 MED ORDER — SODIUM CHLORIDE 0.9 % IV SOLN
INTRAVENOUS | Status: DC
  Administered 2017-08-08: 1000 mL via INTRAVENOUS

## 2017-08-08 MED ORDER — STERILE WATER FOR IRRIGATION IR SOLN
Status: DC | PRN
  Administered 2017-08-08: 09:00:00

## 2017-08-08 MED ORDER — ONDANSETRON HCL 4 MG/2ML IJ SOLN
INTRAMUSCULAR | Status: AC
  Filled 2017-08-08: qty 2

## 2017-08-08 MED ORDER — MIDAZOLAM HCL 5 MG/5ML IJ SOLN
INTRAMUSCULAR | Status: DC | PRN
  Administered 2017-08-08: 2 mg via INTRAVENOUS
  Administered 2017-08-08 (×2): 1 mg via INTRAVENOUS

## 2017-08-08 NOTE — Op Note (Signed)
[facility] Patient Name: Kelly Gay Procedure Date: 08/08/2017 8:48 AM MRN: [id number] Date of Birth: 18-Aug-1946 Attending MD: Norvel Richards , MD CSN: [id number] Age: 71 Admit Type: Outpatient Procedure:                Colonoscopy Indications:              High risk colon cancer surveillance: Personal                            history of colonic polyps Providers:                Norvel Richards, MD, Janeece Riggers, RN, Aram Candela Referring MD:              Medicines:                Midazolam 4 mg IV, Meperidine 75 mg IV, Ondansetron                            4 mg IV Complications:            No immediate complications. Estimated Blood Loss:     Estimated blood loss was minimal. Procedure:                Pre-Anesthesia Assessment:                           - Prior to the procedure, a History and Physical                            was performed, and patient medications and                            allergies were reviewed. The patient's tolerance of                            previous anesthesia was also reviewed. The risks                            and benefits of the procedure and the sedation                            options and risks were discussed with the patient.                            All questions were answered, and informed consent                            was obtained. Prior Anticoagulants: The patient has                            taken no previous anticoagulant or antiplatelet  agents. ASA Grade Assessment: II - A patient with                            mild systemic disease. After reviewing the risks                            and benefits, the patient was deemed in                            satisfactory condition to undergo the procedure.                           After obtaining informed consent, the colonoscope                            was passed under direct vision. Throughout  the                            procedure, the patient's blood pressure, pulse, and                            oxygen saturations were monitored continuously. The                            EC-[id number] ([id number]) scope was introduced through                            the anus and advanced to the the cecum, identified                            by appendiceal orifice and ileocecal valve. The                            colonoscopy was performed without difficulty. The                            patient tolerated the procedure well. The entire                            colon was well visualized. The ileocecal valve,                            appendiceal orifice, and rectum were photographed.                            The quality of the bowel preparation was adequate. Scope In: 9:14:26 AM Scope Out: 9:42:07 AM Scope Withdrawal Time: 0 hours 17 minutes 29 seconds  Total Procedure Duration: 0 hours 27 minutes 41 seconds  Findings:      The perianal and digital rectal examinations were normal.      Three semi-pedunculated polyps were found in the cecum. The polyps were       4 to 8 mm in size.      A 7 mm polyp was found in the sigmoid  colon. The polyp was       semi-pedunculated.      Internal hemorrhoids were found during retroflexion. The hemorrhoids       were Grade I (internal hemorrhoids that do not prolapse).      The exam was otherwise without abnormality on direct and retroflexion       views. Cecal polyps were removed with a hot and snare. Resection and       retrieval were complete. Estimated blood loss was minimal. The sigmoid       polyp was removed with a hot snare. Resection and retrieval were       complete. Estimated blood loss: none. Impression:               - Three 4 to 8 mm polyps in the cecum, removed with                            hot and cold snare. Resected and retrieved.                           - One 7 mm polyp in the sigmoid colon, removed with                             a hot snare. Resected and retrieved.                           - Internal hemorrhoids.                           - The examination was otherwise normal on direct                            and retroflexion views. Moderate Sedation:      Moderate (conscious) sedation was administered by the endoscopy nurse       and supervised by the endoscopist. The following parameters were       monitored: oxygen saturation, heart rate, blood pressure, respiratory       rate, EKG, adequacy of pulmonary ventilation, and response to care.       Total physician intraservice time was 28 minutes. Recommendation:           - Patient has a contact number available for                            emergencies. The signs and symptoms of potential                            delayed complications were discussed with the                            patient. Return to normal activities tomorrow.                            Written discharge instructions were provided to the                            patient.                           -  Resume previous diet.                           - Continue present medications.                           - Repeat colonoscopy date to be determined after                            pending pathology results are reviewed for                            surveillance based on pathology results.                           - Return to GI office (date not yet determined). Procedure Code(s):        --- Professional ---                           [phone], Colonoscopy, flexible; with removal of                            tumor(s), polyp(s), or other lesion(s) by snare                            technique                           99152, Moderate sedation services provided by the                            same physician or other qualified health care                            professional performing the diagnostic or                            therapeutic service that the sedation supports,                             requiring the presence of an independent trained                            observer to assist in the monitoring of the                            patient's level of consciousness and physiological                            status; initial 15 minutes of intraservice time,                            patient age 43 years or older  99153, Moderate sedation services; each additional                            15 minutes intraservice time Diagnosis Code(s):        --- Professional ---                           Z86.010, Personal history of colonic polyps                           D12.0, Benign neoplasm of cecum                           D12.5, Benign neoplasm of sigmoid colon                           K64.0, First degree hemorrhoids CPT copyright 2016 American Medical Association. All rights reserved. The codes documented in this report are preliminary and upon coder review may  be revised to meet current compliance requirements. Cristopher Estimable. Britani Beattie, MD Norvel Richards, MD 08/08/2017 9:51:25 AM This report has been signed electronically. Number of Addenda: 0

## 2017-08-08 NOTE — H&P (Signed)
_0 @   Primary Care Physician:  [facility] Primary Gastroenterologist:  Dr. Gala Romney  Pre-Procedure History & Physical: HPI:  Kelly Gay is a 71 y.o. female here for history of multiple colonic adenomas (some advance) previously. Currently no bowel symptoms. Here for surveillance examination.  Past Medical History:  Diagnosis Date  . Breast cancer ([place])    1998, right   . Bronchitis   . Diabetes ([place])   . HTN (hypertension)   . Hyperlipidemia     Past Surgical History:  Procedure Laterality Date  . ABDOMINAL HYSTERECTOMY    . BREAST SURGERY     partial, right for cancer  . COLON BIOPSY  2005    Dr. Gala Romney: normal  . COLONOSCOPY  2004   Dr. Gala Romney: multiple rectosigmoid polyps, sigmoid polyp with carcinoma in situ  . COLONOSCOPY N/A 07/18/2014   Procedure: COLONOSCOPY;  Surgeon: Daneil Dolin, MD;  Location: [facility];  Service: Endoscopy;  Laterality: N/A;  10:00  . ESOPHAGOGASTRODUODENOSCOPY N/A 07/18/2014   Procedure: ESOPHAGOGASTRODUODENOSCOPY (EGD);  Surgeon: Daneil Dolin, MD;  Location: [facility];  Service: Endoscopy;  Laterality: N/A;    Prior to Admission medications   Medication Sig Start Date End Date Taking? Authorizing Provider  buPROPion (WELLBUTRIN XL) 150 MG 24 hr tablet Take 150 mg by mouth daily.   Yes [provider]  cetirizine (ZYRTEC) 10 MG tablet Take 10 mg by mouth daily as needed for allergies.    Yes [provider]  glipiZIDE (GLUCOTROL) 10 MG tablet Take 10 mg by mouth 2 (two) times daily.   Yes [provider]  hydrochlorothiazide (MICROZIDE) 12.5 MG capsule Take 12.5 mg by mouth daily.   Yes [provider]  losartan (COZAAR) 25 MG tablet Take 25 mg by mouth daily.   Yes [provider]  metFORMIN (GLUCOPHAGE) 1000 MG tablet Take 1,000 mg by mouth 2 (two) times daily.    Yes [provider]  Multiple Vitamins-Minerals (CENTRUM ULTRA WOMENS) TABS Take 1  tablet by mouth daily.    Yes [provider]  Na Sulfate-K Sulfate-Mg Sulf (SUPREP BOWEL PREP KIT) 17.5-3.13-1.6 GM/180ML SOLN Take 1 kit by mouth as directed. 06/23/17  Yes Rourk, Cristopher Estimable, MD  Omega-3 Fatty Acids (FISH OIL ULTRA) 1400 MG CAPS Take 1,400 mg by mouth daily.   Yes [provider]  ibuprofen (ADVIL,MOTRIN) 200 MG tablet Take 400 mg by mouth every 4 (four) hours as needed for headache or moderate pain.    [provider]    Allergies as of 06/23/2017  . (No Known Allergies)    Family History  Problem Relation Age of Onset  . Throat cancer Brother   . Lung cancer Brother   . Cancer Brother        ?prostate  . COPD Mother   . Heart attack Father   . Colon cancer Neg Hx     Social History   Social History  . Marital status: Married    Spouse name: N/A  . Number of children: 2  . Years of education: N/A   Occupational History  . Not on file.   Social History Main Topics  . Smoking status: Former Research scientist (life sciences)  . Smokeless tobacco: Never Used     Comment: quit in July 2018  . Alcohol use No  . Drug use: No  . Sexual activity: Yes    Birth control/ protection: Surgical   Other Topics Concern  .  Not on file   Social History Narrative  . No narrative on file    Review of Systems: See HPI, otherwise negative ROS  Physical Exam: BP (!) 141/74   Pulse (!) 102   Temp (!) 97.4 F (36.3 C) (Oral)   Resp (!) 22   Ht _0  (1.626 m)   Wt 112 lb (50.8 kg)   SpO2 97%   BMI 19.22 kg/m  General:   Alert,  Well-developed, well-nourished, pleasant and cooperative in NAD SNeck:  Supple; no masses or thyromegaly. No significant cervical adenopathy. Lungs:  Clear throughout to auscultation.   No wheezes, crackles, or rhonchi. No acute distress. Heart:  Regular rate and rhythm; no murmurs, clicks, rubs,  or gallops. Abdomen: Non-distended, normal bowel sounds.  Soft and nontender without appreciable mass or hepatosplenomegaly.  Pulses:   Normal pulses noted. Extremities:  Without clubbing or edema.  Impression:  Pleasant 71 year old lady with a history of colonic adenomas-some advanced. Here for surveillance examination   Recommendations:  I have offered the patient a surveillance colonoscopy today. The risks, benefits, limitations, alternatives and imponderables have been reviewed with the patient. Questions have been answered. All parties are agreeable.          Notice: This dictation was prepared with Dragon dictation along with smaller phrase technology. Any transcriptional errors that result from this process are unintentional and may not be corrected upon review.

## 2017-08-08 NOTE — Discharge Instructions (Signed)
°Colon polyp and diverticulosis information provided ° °Further recommendations to follow pending review of pathology report °Colonoscopy °Discharge Instructions ° °Read the instructions outlined below and refer to this sheet in the next few weeks. These discharge instructions provide you with general information on caring for yourself after you leave the hospital. Your doctor may also give you specific instructions. While your treatment has been planned according to the most current medical practices available, unavoidable complications occasionally occur. If you have any problems or questions after discharge, call Dr. Rourk at [phone]. °ACTIVITY °· You may resume your regular activity, but move at a slower pace for the next 24 hours.  °· Take frequent rest periods for the next 24 hours.  °· Walking will help get rid of the air and reduce the bloated feeling in your belly (abdomen).  °· No driving for 24 hours (because of the medicine (anesthesia) used during the test).   °· Do not sign any important legal documents or operate any machinery for 24 hours (because of the anesthesia used during the test).  °NUTRITION °· Drink plenty of fluids.  °· You may resume your normal diet as instructed by your doctor.  °· Begin with a light meal and progress to your normal diet. Heavy or fried foods are harder to digest and may make you feel sick to your stomach (nauseated).  °· Avoid alcoholic beverages for 24 hours or as instructed.  °MEDICATIONS °· You may resume your normal medications unless your doctor tells you otherwise.  °WHAT YOU CAN EXPECT TODAY °· Some feelings of bloating in the abdomen.  °· Passage of more gas than usual.  °· Spotting of blood in your stool or on the toilet paper.  °IF YOU HAD POLYPS REMOVED DURING THE COLONOSCOPY: °· No aspirin products for 7 days or as instructed.  °· No alcohol for 7 days or as instructed.  °· Eat a soft diet for the next 24 hours.  °FINDING OUT THE RESULTS OF YOUR TEST °Not  all test results are available during your visit. If your test results are not back during the visit, make an appointment with your caregiver to find out the results. Do not assume everything is normal if you have not heard from your caregiver or the medical facility. It is important for you to follow up on all of your test results.  °SEEK IMMEDIATE MEDICAL ATTENTION IF: °· You have more than a spotting of blood in your stool.  °· Your belly is swollen (abdominal distention).  °· You are nauseated or vomiting.  °· You have a temperature over 101.  °· You have abdominal pain or discomfort that is severe or gets worse throughout the day.  ° ° °Colon Polyps °Polyps are tissue growths inside the body. Polyps can grow in many places, including the large intestine (colon). A polyp may be a round bump or a mushroom-shaped growth. You could have one polyp or several. °Most colon polyps are noncancerous (benign). However, some colon polyps can become cancerous over time. °What are the causes? °The exact cause of colon polyps is not known. °What increases the risk? °This condition is more likely to develop in people who: °· Have a family history of colon cancer or colon polyps. °· Are older than 50 or older than 45 if they are African American. °· Have inflammatory bowel disease, such as ulcerative colitis or Crohn disease. °· Are overweight. °· Smoke cigarettes. °· Do not get enough exercise. °· Drink too much alcohol. °·   Eat a diet that is: °? High in fat and red meat. °? Low in fiber. °· Had childhood cancer that was treated with abdominal radiation. ° °What are the signs or symptoms? °Most polyps do not cause symptoms. If you have symptoms, they may include: °· Blood coming from your rectum when having a bowel movement. °· Blood in your stool. The stool may look dark red or black. °· A change in bowel habits, such as constipation or diarrhea. ° °How is this diagnosed? °This condition is diagnosed with a colonoscopy. This  is a procedure that uses a lighted, flexible scope to look at the inside of your colon. °How is this treated? °Treatment for this condition involves removing any polyps that are found. Those polyps will then be tested for cancer. If cancer is found, your health care provider will talk to you about options for colon cancer treatment. °Follow these instructions at home: °Diet °· Eat plenty of fiber, such as fruits, vegetables, and whole grains. °· Eat foods that are high in calcium and vitamin D, such as milk, cheese, yogurt, eggs, liver, fish, and broccoli. °· Limit foods high in fat, red meats, and processed meats, such as hot dogs, sausage, bacon, and lunch meats. °· Maintain a healthy weight, or lose weight if recommended by your health care provider. °General instructions °· Do not smoke cigarettes. °· Do not drink alcohol excessively. °· Keep all follow-up visits as told by your health care provider. This is important. This includes keeping regularly scheduled colonoscopies. Talk to your health care provider about when you need a colonoscopy. °· Exercise every day or as told by your health care provider. °Contact a health care provider if: °· You have new or worsening bleeding during a bowel movement. °· You have new or increased blood in your stool. °· You have a change in bowel habits. °· You unexpectedly lose weight. °This information is not intended to replace advice given to you by your health care provider. Make sure you discuss any questions you have with your health care provider. °Document Released: 09/11/2004 Document Revised: 05/23/2016 Document Reviewed: 11/06/2015 °Elsevier Interactive Patient Education © 2018 [employer] ° ° °Diverticulosis °Diverticulosis is a condition that develops when small pouches (diverticula) form in the wall of the large intestine (colon). The colon is where water is absorbed and stool is formed. The pouches form when the inside layer of the colon pushes through weak  spots in the outer layers of the colon. You may have a few pouches or many of them. °What are the causes? °The cause of this condition is not known. °What increases the risk? °The following factors may make you more likely to develop this condition: °· Being older than age 60. Your risk for this condition increases with age. Diverticulosis is rare among people younger than age 30. By age 80, many people have it. °· Eating a low-fiber diet. °· Having frequent constipation. °· Being overweight. °· Not getting enough exercise. °· Smoking. °· Taking over-the-counter pain medicines, like aspirin and ibuprofen. °· Having a family history of diverticulosis. ° °What are the signs or symptoms? °In most people, there are no symptoms of this condition. If you do have symptoms, they may include: °· Bloating. °· Cramps in the abdomen. °· Constipation or diarrhea. °· Pain in the lower left side of the abdomen. ° °How is this diagnosed? °This condition is most often diagnosed during an exam for other colon problems. Because diverticulosis usually has no symptoms, it often   cannot be diagnosed independently. This condition may be diagnosed by: °· Using a flexible scope to examine the colon (colonoscopy). °· Taking an X-ray of the colon after dye has been put into the colon (barium enema). °· Doing a CT scan. ° °How is this treated? °You may not need treatment for this condition if you have never developed an infection related to diverticulosis. If you have had an infection before, treatment may include: °· Eating a high-fiber diet. This may include eating more fruits, vegetables, and grains. °· Taking a fiber supplement. °· Taking a live bacteria supplement (probiotic). °· Taking medicine to relax your colon. °· Taking antibiotic medicines. ° °Follow these instructions at home: °· Drink 6-8 glasses of water or more each day to prevent constipation. °· Try not to strain when you have a bowel movement. °· If you have had an infection  before: °? Eat more fiber as directed by your health care provider or your diet and nutrition specialist (dietitian). °? Take a fiber supplement or probiotic, if your health care provider approves. °· Take over-the-counter and prescription medicines only as told by your health care provider. °· If you were prescribed an antibiotic, take it as told by your health care provider. Do not stop taking the antibiotic even if you start to feel better. °· Keep all follow-up visits as told by your health care provider. This is important. °Contact a health care provider if: °· You have pain in your abdomen. °· You have bloating. °· You have cramps. °· You have not had a bowel movement in 3 days. °Get help right away if: °· Your pain gets worse. °· Your bloating becomes very bad. °· You have a fever or chills, and your symptoms suddenly get worse. °· You vomit. °· You have bowel movements that are bloody or black. °· You have bleeding from your rectum. °Summary °· Diverticulosis is a condition that develops when small pouches (diverticula) form in the wall of the large intestine (colon). °· You may have a few pouches or many of them. °· This condition is most often diagnosed during an exam for other colon problems. °· If you have had an infection related to diverticulosis, treatment may include increasing the fiber in your diet, taking supplements, or taking medicines. °This information is not intended to replace advice given to you by your health care provider. Make sure you discuss any questions you have with your health care provider. °Document Released: 09/12/2004 Document Revised: 11/04/2016 Document Reviewed: 11/04/2016 °Elsevier Interactive Patient Education © 2017 [employer] Inc. ° °

## 2017-08-11 ENCOUNTER — Inpatient Hospital Stay (HOSPITAL_COMMUNITY): Payer: Medicare Other

## 2017-08-11 ENCOUNTER — Inpatient Hospital Stay (HOSPITAL_COMMUNITY)
Admission: EM | Admit: 2017-08-11 | Discharge: 2017-08-20 | DRG: 186 | Disposition: A | Discharge status: Home Health | Payer: Medicare Other | Attending: Internal Medicine | Admitting: Internal Medicine

## 2017-08-11 ENCOUNTER — Emergency Department (HOSPITAL_COMMUNITY): Payer: Medicare Other

## 2017-08-11 ENCOUNTER — Inpatient Hospital Stay: Payer: Medicare Other

## 2017-08-11 ENCOUNTER — Encounter (HOSPITAL_COMMUNITY): Payer: Self-pay | Admitting: Emergency Medicine

## 2017-08-11 ENCOUNTER — Encounter: Payer: Self-pay | Admitting: Internal Medicine

## 2017-08-11 DIAGNOSIS — N179 Acute kidney failure, unspecified: Secondary | ICD-10-CM | POA: Diagnosis present

## 2017-08-11 DIAGNOSIS — J189 Pneumonia, unspecified organism: Secondary | ICD-10-CM | POA: Diagnosis present

## 2017-08-11 DIAGNOSIS — D649 Anemia, unspecified: Secondary | ICD-10-CM | POA: Diagnosis present

## 2017-08-11 DIAGNOSIS — Z7984 Long term (current) use of oral hypoglycemic drugs: Secondary | ICD-10-CM

## 2017-08-11 DIAGNOSIS — Z9689 Presence of other specified functional implants: Secondary | ICD-10-CM

## 2017-08-11 DIAGNOSIS — E785 Hyperlipidemia, unspecified: Secondary | ICD-10-CM | POA: Diagnosis present

## 2017-08-11 DIAGNOSIS — Z66 Do not resuscitate: Secondary | ICD-10-CM | POA: Diagnosis present

## 2017-08-11 DIAGNOSIS — F17200 Nicotine dependence, unspecified, uncomplicated: Secondary | ICD-10-CM | POA: Diagnosis not present

## 2017-08-11 DIAGNOSIS — R0600 Dyspnea, unspecified: Secondary | ICD-10-CM | POA: Diagnosis present

## 2017-08-11 DIAGNOSIS — K219 Gastro-esophageal reflux disease without esophagitis: Secondary | ICD-10-CM | POA: Diagnosis present

## 2017-08-11 DIAGNOSIS — E119 Type 2 diabetes mellitus without complications: Secondary | ICD-10-CM | POA: Diagnosis present

## 2017-08-11 DIAGNOSIS — J939 Pneumothorax, unspecified: Secondary | ICD-10-CM

## 2017-08-11 DIAGNOSIS — I10 Essential (primary) hypertension: Secondary | ICD-10-CM | POA: Diagnosis present

## 2017-08-11 DIAGNOSIS — Z9889 Other specified postprocedural states: Secondary | ICD-10-CM

## 2017-08-11 DIAGNOSIS — J9621 Acute and chronic respiratory failure with hypoxia: Secondary | ICD-10-CM | POA: Diagnosis present

## 2017-08-11 DIAGNOSIS — J440 Chronic obstructive pulmonary disease with acute lower respiratory infection: Secondary | ICD-10-CM | POA: Diagnosis present

## 2017-08-11 DIAGNOSIS — J90 Pleural effusion, not elsewhere classified: Principal | ICD-10-CM | POA: Diagnosis present

## 2017-08-11 DIAGNOSIS — Z853 Personal history of malignant neoplasm of breast: Secondary | ICD-10-CM | POA: Diagnosis not present

## 2017-08-11 DIAGNOSIS — Z87891 Personal history of nicotine dependence: Secondary | ICD-10-CM

## 2017-08-11 DIAGNOSIS — Z4682 Encounter for fitting and adjustment of non-vascular catheter: Secondary | ICD-10-CM

## 2017-08-11 DIAGNOSIS — Z79899 Other long term (current) drug therapy: Secondary | ICD-10-CM

## 2017-08-11 LAB — CBC WITH DIFFERENTIAL/PLATELET
BASOS ABS: 0 10*3/uL (ref 0.0–0.1)
BASOS PCT: 0 %
EOS PCT: 1 %
Eosinophils Absolute: 0.1 10*3/uL (ref 0.0–0.7)
HCT: 31.3 % — ABNORMAL LOW (ref 36.0–46.0)
Hemoglobin: 10 g/dL — ABNORMAL LOW (ref 12.0–15.0)
Lymphocytes Relative: 13 %
Lymphs Abs: 1.4 10*3/uL (ref 0.7–4.0)
MCH: 27.9 pg (ref 26.0–34.0)
MCHC: 31.9 g/dL (ref 30.0–36.0)
MCV: 87.4 fL (ref 78.0–100.0)
MONO ABS: 1.1 10*3/uL — AB (ref 0.1–1.0)
Monocytes Relative: 10 %
NEUTROS ABS: 8.7 10*3/uL — AB (ref 1.7–7.7)
Neutrophils Relative %: 76 %
PLATELETS: 356 10*3/uL (ref 150–400)
RBC: 3.58 MIL/uL — AB (ref 3.87–5.11)
RDW: 14.9 % (ref 11.5–15.5)
WBC: 11.3 10*3/uL — AB (ref 4.0–10.5)

## 2017-08-11 LAB — COMPREHENSIVE METABOLIC PANEL
ALBUMIN: 3.1 g/dL — AB (ref 3.5–5.0)
ALT: 11 U/L — ABNORMAL LOW (ref 14–54)
AST: 15 U/L (ref 15–41)
Alkaline Phosphatase: 46 U/L (ref 38–126)
Anion gap: 11 (ref 5–15)
BUN: 20 mg/dL (ref 6–20)
CHLORIDE: 99 mmol/L — AB (ref 101–111)
CO2: 25 mmol/L (ref 22–32)
Calcium: 8.9 mg/dL (ref 8.9–10.3)
Creatinine, Ser: 1.12 mg/dL — ABNORMAL HIGH (ref 0.44–1.00)
GFR calc Af Amer: 56 mL/min — ABNORMAL LOW (ref >60)
GFR calc non Af Amer: 48 mL/min — ABNORMAL LOW (ref >60)
GLUCOSE: 156 mg/dL — AB (ref 65–99)
POTASSIUM: 3.5 mmol/L (ref 3.5–5.1)
Sodium: 135 mmol/L (ref 135–145)
Total Bilirubin: 0.5 mg/dL (ref 0.3–1.2)
Total Protein: 7.4 g/dL (ref 6.5–8.1)

## 2017-08-11 LAB — GRAM STAIN

## 2017-08-11 LAB — PROTEIN, PLEURAL OR PERITONEAL FLUID: Total protein, fluid: 4.9 g/dL

## 2017-08-11 LAB — LACTATE DEHYDROGENASE, PLEURAL OR PERITONEAL FLUID: LD FL: 162 U/L — AB (ref 3–23)

## 2017-08-11 LAB — GLUCOSE, CAPILLARY: GLUCOSE-CAPILLARY: 157 mg/dL — AB (ref 65–99)

## 2017-08-11 LAB — ALBUMIN, PLEURAL OR PERITONEAL FLUID: ALBUMIN FL: 2.7 g/dL

## 2017-08-11 LAB — MRSA PCR SCREENING: MRSA by PCR: NEGATIVE

## 2017-08-11 LAB — BODY FLUID CELL COUNT WITH DIFFERENTIAL
EOS FL: 0 %
Lymphs, Fluid: 79 %
MONOCYTE-MACROPHAGE-SEROUS FLUID: 14 % — AB (ref 50–90)
Neutrophil Count, Fluid: 7 % (ref 0–25)
WBC FLUID: 352 uL (ref 0–1000)

## 2017-08-11 LAB — BRAIN NATRIURETIC PEPTIDE: B Natriuretic Peptide: 83 pg/mL (ref 0.0–100.0)

## 2017-08-11 LAB — D-DIMER, QUANTITATIVE: D-Dimer, Quant: 2.63 ug/mL-FEU — ABNORMAL HIGH (ref 0.00–0.50)

## 2017-08-11 LAB — TROPONIN I: Troponin I: <0.03 ng/mL (ref <0.03)

## 2017-08-11 LAB — GLUCOSE, PLEURAL OR PERITONEAL FLUID: Glucose, Fluid: 124 mg/dL

## 2017-08-11 LAB — LACTATE DEHYDROGENASE: LDH: 139 U/L (ref 98–192)

## 2017-08-11 MED ORDER — DEXTROSE 5 % IV SOLN
1.0000 g | Freq: Once | INTRAVENOUS | Status: AC
  Administered 2017-08-11: 1 g via INTRAVENOUS
  Filled 2017-08-11: qty 10

## 2017-08-11 MED ORDER — ADULT MULTIVITAMIN W/MINERALS CH
1.0000 | ORAL_TABLET | Freq: Every day | ORAL | Status: DC
  Administered 2017-08-11 – 2017-08-20 (×9): 1 via ORAL
  Filled 2017-08-11 (×9): qty 1

## 2017-08-11 MED ORDER — MORPHINE SULFATE (PF) 2 MG/ML IV SOLN: 2.0000 mg | INTRAVENOUS | Status: DC | PRN

## 2017-08-11 MED ORDER — INSULIN ASPART 100 UNIT/ML ~~LOC~~ SOLN
0.0000 [IU] | Freq: Every day | SUBCUTANEOUS | Status: DC
Start: 2017-08-11 — End: 2017-08-20
  Administered 2017-08-12 – 2017-08-15 (×2): 2 [IU] via SUBCUTANEOUS
  Administered 2017-08-18 – 2017-08-19 (×2): 3 [IU] via SUBCUTANEOUS

## 2017-08-11 MED ORDER — IOPAMIDOL (ISOVUE-370) INJECTION 76%
100.0000 mL | Freq: Once | INTRAVENOUS | Status: AC | PRN
  Administered 2017-08-11: 100 mL via INTRAVENOUS

## 2017-08-11 MED ORDER — ONDANSETRON HCL 4 MG PO TABS: 4.0000 mg | ORAL_TABLET | Freq: Four times a day (QID) | ORAL | Status: DC | PRN

## 2017-08-11 MED ORDER — DOCUSATE SODIUM 100 MG PO CAPS
100.0000 mg | ORAL_CAPSULE | Freq: Two times a day (BID) | ORAL | Status: DC
  Administered 2017-08-11 – 2017-08-20 (×17): 100 mg via ORAL
  Filled 2017-08-11 (×17): qty 1

## 2017-08-11 MED ORDER — ONDANSETRON HCL 4 MG/2ML IJ SOLN: 4.0000 mg | Freq: Four times a day (QID) | INTRAMUSCULAR | Status: DC | PRN

## 2017-08-11 MED ORDER — INSULIN ASPART 100 UNIT/ML ~~LOC~~ SOLN
0.0000 [IU] | Freq: Three times a day (TID) | SUBCUTANEOUS | Status: DC
  Administered 2017-08-12: 1 [IU] via SUBCUTANEOUS
  Administered 2017-08-12: 3 [IU] via SUBCUTANEOUS
  Administered 2017-08-13 (×2): 1 [IU] via SUBCUTANEOUS
  Administered 2017-08-13: 5 [IU] via SUBCUTANEOUS
  Administered 2017-08-14: 2 [IU] via SUBCUTANEOUS
  Administered 2017-08-14: 1 [IU] via SUBCUTANEOUS
  Administered 2017-08-14 – 2017-08-15 (×2): 2 [IU] via SUBCUTANEOUS
  Administered 2017-08-15: 3 [IU] via SUBCUTANEOUS
  Administered 2017-08-15: 1 [IU] via SUBCUTANEOUS
  Administered 2017-08-16: 3 [IU] via SUBCUTANEOUS
  Administered 2017-08-16 – 2017-08-17 (×4): 1 [IU] via SUBCUTANEOUS
  Administered 2017-08-17 – 2017-08-18 (×2): 5 [IU] via SUBCUTANEOUS
  Administered 2017-08-18 – 2017-08-19 (×2): 2 [IU] via SUBCUTANEOUS
  Administered 2017-08-19: 5 [IU] via SUBCUTANEOUS
  Administered 2017-08-19: 3 [IU] via SUBCUTANEOUS
  Administered 2017-08-20 (×2): 2 [IU] via SUBCUTANEOUS

## 2017-08-11 MED ORDER — LORATADINE 10 MG PO TABS
10.0000 mg | ORAL_TABLET | Freq: Every day | ORAL | Status: DC
  Administered 2017-08-15 – 2017-08-17 (×2): 10 mg via ORAL
  Filled 2017-08-11 (×7): qty 1

## 2017-08-11 MED ORDER — HYDROCHLOROTHIAZIDE 12.5 MG PO CAPS
12.5000 mg | ORAL_CAPSULE | Freq: Every day | ORAL | Status: DC
  Administered 2017-08-11 – 2017-08-20 (×10): 12.5 mg via ORAL
  Filled 2017-08-11 (×10): qty 1

## 2017-08-11 MED ORDER — OMEGA-3-ACID ETHYL ESTERS 1 G PO CAPS
1.0000 | ORAL_CAPSULE | Freq: Every day | ORAL | Status: DC
  Administered 2017-08-11 – 2017-08-20 (×9): 1 g via ORAL
  Filled 2017-08-11 (×9): qty 1

## 2017-08-11 MED ORDER — LOSARTAN POTASSIUM 50 MG PO TABS
25.0000 mg | ORAL_TABLET | Freq: Every day | ORAL | Status: DC
  Administered 2017-08-11 – 2017-08-20 (×9): 25 mg via ORAL
  Filled 2017-08-11 (×10): qty 1

## 2017-08-11 MED ORDER — VANCOMYCIN HCL IN DEXTROSE 750-5 MG/150ML-% IV SOLN
750.0000 mg | INTRAVENOUS | Status: DC
  Administered 2017-08-12 – 2017-08-17 (×6): 750 mg via INTRAVENOUS
  Filled 2017-08-11 (×6): qty 150

## 2017-08-11 MED ORDER — DEXTROSE 5 % IV SOLN
500.0000 mg | Freq: Once | INTRAVENOUS | Status: AC
  Administered 2017-08-11: 500 mg via INTRAVENOUS
  Filled 2017-08-11: qty 500

## 2017-08-11 MED ORDER — ACETAMINOPHEN 650 MG RE SUPP: 650.0000 mg | Freq: Four times a day (QID) | RECTAL | Status: DC | PRN

## 2017-08-11 MED ORDER — BUPROPION HCL ER (XL) 150 MG PO TB24
150.0000 mg | ORAL_TABLET | Freq: Every day | ORAL | Status: DC
  Administered 2017-08-11 – 2017-08-20 (×10): 150 mg via ORAL
  Filled 2017-08-11 (×10): qty 1

## 2017-08-11 MED ORDER — VANCOMYCIN HCL IN DEXTROSE 1-5 GM/200ML-% IV SOLN
1000.0000 mg | Freq: Once | INTRAVENOUS | Status: AC
  Administered 2017-08-11: 1000 mg via INTRAVENOUS
  Filled 2017-08-11: qty 200

## 2017-08-11 MED ORDER — ENOXAPARIN SODIUM 40 MG/0.4ML ~~LOC~~ SOLN
40.0000 mg | SUBCUTANEOUS | Status: DC
  Administered 2017-08-12 – 2017-08-14 (×3): 40 mg via SUBCUTANEOUS
  Filled 2017-08-11 (×3): qty 0.4

## 2017-08-11 MED ORDER — LORAZEPAM 2 MG/ML IJ SOLN
0.5000 mg | Freq: Once | INTRAMUSCULAR | Status: AC | PRN
  Administered 2017-08-11: 0.5 mg via INTRAVENOUS
  Filled 2017-08-11: qty 1

## 2017-08-11 MED ORDER — ACETAMINOPHEN 325 MG PO TABS
650.0000 mg | ORAL_TABLET | Freq: Four times a day (QID) | ORAL | Status: DC | PRN
  Administered 2017-08-18: 650 mg via ORAL
  Filled 2017-08-11: qty 2

## 2017-08-11 MED ORDER — PIPERACILLIN-TAZOBACTAM 3.375 G IVPB
3.3750 g | Freq: Three times a day (TID) | INTRAVENOUS | Status: DC
  Administered 2017-08-11 – 2017-08-19 (×23): 3.375 g via INTRAVENOUS
  Filled 2017-08-11 (×24): qty 50

## 2017-08-11 NOTE — ED Notes (Signed)
Pt reports worsening chest pain after going to bathroom.  EDp notified.

## 2017-08-11 NOTE — H&P (Signed)
History and Physical    Kelly Gay ELT:[id number] DOB: Jan 20, 1946 DOA: 08/11/2017  PCP: [facility] Consultants:  None Patient coming from: Home - lives with husband; NOK: husband, [phone]  Chief Complaint: "I can't breathe"  HPI: Kelly Gay is a 71 y.o. female with medical history significant of long-standing tobacco dependence (quit 2-3 weeks ago); remote breast cancer without known recurrence; colon polyps (recent colonoscopy, 8/10, with removal of 4 colon polyps and finding of internal hemorrhoids, repeating q3y); DM; HTN; and HLD presenting with progressive SOB.    She has been SOB x 13 days, but today is the worst day.  She initially started feeling bad in May - went to visit her son in [place] and after driving down she noticed some SOB.  It continued and she went to see her PCP after her return home and was given an inhaler without improvemet.  Steady symptoms and then she had acute worsening about 2 weeks ago.  No specific cause but she noticed it when she got up that AM, it "seemed to be an overnight thing".  It seemed to steady again and then last night it really got bad.  She was unable to sleep, had to sit up on the side of the bed most of the night, unable to breathe.   Called PCP this AM, went into urgent care and they sent her here by ambulance.  She had a colonoscopy Friday and nurse commented that "oxygen content in the blood was ok but she couldn't seem to breathe well". Slight cough last night but that has not been a big issue.  No wheezing. No fevers. No LE edema.  It is bad all the time, not just with lying flat or ambulating.  Appetite is not as good, +unintentional weight loss of 10 pounds or more in the last few months.  She quit smoking when things got worse, maybe 2-3 weeks ago.  No night sweats.    ED Course:  Large right-sided pleural effusion vs. Consolidation on CXT and CT.  Started on CAP antibiotics.  Thoracentesis with  IR.  Review of Systems: As per HPI; otherwise review of systems reviewed and negative.   Ambulatory Status:  Ambulates without assistance  Past Medical History:  Diagnosis Date  . Breast cancer ([place]) 1998   Right nipple inversion s/p lumpectomy/resection, chemo/rads, no recurrence  . Bronchitis   . Diabetes ([place])   . HTN (hypertension)   . Hyperlipidemia     Past Surgical History:  Procedure Laterality Date  . ABDOMINAL HYSTERECTOMY    . BREAST SURGERY     partial, right for cancer  . COLON BIOPSY  2005    Dr. Gala Romney: normal  . COLONOSCOPY  2004   Dr. Gala Romney: multiple rectosigmoid polyps, sigmoid polyp with carcinoma in situ  . COLONOSCOPY N/A 07/18/2014   Procedure: COLONOSCOPY;  Surgeon: Daneil Dolin, MD;  Location: [facility];  Service: Endoscopy;  Laterality: N/A;  10:00  . ESOPHAGOGASTRODUODENOSCOPY N/A 07/18/2014   Procedure: ESOPHAGOGASTRODUODENOSCOPY (EGD);  Surgeon: Daneil Dolin, MD;  Location: [facility];  Service: Endoscopy;  Laterality: N/A;    Social History   Social History  . Marital status: Married    Spouse name: N/A  . Number of children: 2  . Years of education: N/A   Occupational History  . Retired    Social History Main Topics  . Smoking status: Former Smoker    Packs/day: 1.00  Years: 50.00    Types: Cigarettes  . Smokeless tobacco: Never Used     Comment: quit in July 2018  . Alcohol use No  . Drug use: No  . Sexual activity: Yes    Birth control/ protection: Surgical   Other Topics Concern  . Not on file   Social History Narrative  . No narrative on file    Allergies  Allergen Reactions  . Incruse Ellipta [Umeclidinium Bromide]     Anxiety     Family History  Problem Relation Age of Onset  . Throat cancer Brother   . Lung cancer Brother   . Cancer Brother        ?prostate  . COPD Mother   . Heart attack Father   . Colon cancer Neg Hx     Prior to Admission medications   Medication Sig Start Date End Date  Taking? Authorizing Provider  buPROPion (WELLBUTRIN XL) 150 MG 24 hr tablet Take 150 mg by mouth daily.   Yes [provider]  cetirizine (ZYRTEC) 10 MG tablet Take 10 mg by mouth daily as needed for allergies.    Yes [provider]  glipiZIDE (GLUCOTROL) 10 MG tablet Take 10 mg by mouth 2 (two) times daily.   Yes [provider]  hydrochlorothiazide (MICROZIDE) 12.5 MG capsule Take 12.5 mg by mouth daily.   Yes [provider]  ibuprofen (ADVIL,MOTRIN) 200 MG tablet Take 400 mg by mouth every 4 (four) hours as needed for headache or moderate pain.   Yes [provider]  losartan (COZAAR) 25 MG tablet Take 25 mg by mouth daily.   Yes [provider]  metFORMIN (GLUCOPHAGE) 1000 MG tablet Take 1,000 mg by mouth 2 (two) times daily.    Yes [provider]  Multiple Vitamins-Minerals (CENTRUM ULTRA WOMENS) TABS Take 1 tablet by mouth daily.    Yes [provider]  Na Sulfate-K Sulfate-Mg Sulf (SUPREP BOWEL PREP KIT) 17.5-3.13-1.6 GM/180ML SOLN Take 1 kit by mouth as directed. 06/23/17  Yes Rourk, Cristopher Estimable, MD  Omega-3 Fatty Acids (FISH OIL ULTRA) 1400 MG CAPS Take 1,400 mg by mouth daily.   Yes [provider]    Physical Exam: Vitals:   08/11/17 1700 08/11/17 1710 08/11/17 1754 08/11/17 1932  BP: (!) 155/80  137/68   Pulse:  (!) 106 (!) 108   Resp: (!) 30 (!) 29 (!) 21   Temp:   98.2 F (36.8 C)   TempSrc:   Oral   SpO2:  96% 98% 96%  Weight:      Height:   '5\' 4"'  (1.626 m)      General:  Appears short of breath with tachypnea and purse-lipped breathing Eyes:  PERRL, EOMI, normal lids, iris ENT:  grossly normal hearing, lips & tongue, mmm Neck:  no LAD, masses or thyromegaly; no carotid bruits Cardiovascular:  RRR, no m/r/g. No LE edema.  Respiratory:  Markedly decreased breath sounds on the right with improving air movement in the upper lobe.  Increased respiratory effort. Abdomen:  soft, NT, ND,  NABS Back:   normal alignment, no CVAT Skin:  no rash or induration seen on limited exam Musculoskeletal:  grossly normal tone BUE/BLE, good ROM, no bony abnormality Psychiatric:  grossly normal mood and affect, speech fluent and appropriate, AOx3 Neurologic:  CN 2-12 grossly intact, moves all extremities in coordinated fashion, sensation intact    Radiological Exams on Admission: Dg Chest 1 View  Result Date: 08/11/2017 CLINICAL DATA:  Post RIGHT thoracentesis EXAM: CHEST 1 VIEW COMPARISON:  Earlier exam of 08/11/2017 FINDINGS: Normal heart size mediastinal contours. Atherosclerotic calcification aorta. Slight decrease in RIGHT pleural effusion and basilar atelectasis post thoracentesis removing 1.2 L of fluid. No pneumothorax. LEFT lung appears emphysematous but clear. Bones demineralized. IMPRESSION: Decrease in RIGHT pleural effusion and basilar atelectasis post thoracentesis without evidence of pneumothorax. Electronically Signed   By: Lavonia Dana M.D.   On: 08/11/2017 17:14   Dg Chest 2 View  Result Date: 08/11/2017 CLINICAL DATA:  Shortness of breath and chest pain. History of breast carcinoma EXAM: CHEST  2 VIEW COMPARISON:  Chest CT October 02, 2009 FINDINGS: There is extensive airspace consolidation throughout the right middle and lower lobes. There is also consolidation in the inferior aspect of the anterior segment right upper lobe. There may be a degree of intermixed pleural fluid. The left lung is clear except for trace left base interstitial edema. Heart size and pulmonary vascularity are normal. No evident adenopathy. No bone lesions. IMPRESSION: Widespread airspace consolidation consistent with multifocal pneumonia on the right. There may be a degree of pleural effusion intermixed with the consolidation. Trace interstitial edema left base. The possibility of mild lymphangitic spread of tumor in the left base must be of concern given history of breast carcinoma. Cardiac silhouette  within normal limits. Electronically Signed   By: Lowella Grip III M.D.   On: 08/11/2017 13:39   Ct Angio Chest Pe W And/or Wo Contrast  Result Date: 08/11/2017 CLINICAL DATA:  Shortness of breath for 13 days. History breast cancer. EXAM: CT ANGIOGRAPHY CHEST WITH CONTRAST TECHNIQUE: Multidetector CT imaging of the chest was performed using the standard protocol during bolus administration of intravenous contrast. Multiplanar CT image reconstructions and MIPs were obtained to evaluate the vascular anatomy. CONTRAST:  100 cc Isovue 370 intravenous COMPARISON:  10/02/2009 FINDINGS: Cardiovascular: Satisfactory opacification of the pulmonary arteries to the segmental level. No evidence of pulmonary embolism. Normal heart size. No pericardial effusion. Atherosclerotic calcifications seen along the aorta and left coronaries. Mediastinum/Nodes: Mild enlargement of the right juxta diaphragmatic and hilar lymph nodes, nonspecific in this setting. No generalized adenopathy. There is a right tracheoesophageal groove thyroid nodule that is stable if not decreased from 2010 CT. Lungs/Pleura: Large right pleural effusion with loculation. Except for the superior segment, the right lower lobe is collapsed with scalloping. There is also collapse and distortion of the right middle lobe. The aerated right lung is free of pneumonia or nodularity. Emphysema, primarily paraseptal. There is likely some pleural thickening posteriorly and medially on the right. No discrete mass is seen. Small left lateral costophrenic sulcus nodule is stable from 2010 and benign. Upper Abdomen: No acute finding Musculoskeletal: Remote and healed proximal right humerus fracture. Review of the MIP images confirms the above findings. IMPRESSION: 1. Large and loculated right pleural effusion with multi segment collapse. There are areas of mild pleural thickening without discrete mass lesion. 2. Mild right hilar and juxta diaphragmatic adenopathy,  significance to be determined based on thoracentesis. 3. Negative for pulmonary embolism. 4.  Aortic Atherosclerosis (ICD10-I70.0). 5.  Emphysema (ICD10-J43.9). Electronically Signed   By: Monte Fantasia M.D.   On: 08/11/2017 14:52   US Thoracentesis Asp Pleural Space W/img Guide  Result Date: 08/11/2017 INDICATION: RIGHT pleural effusion EXAM: ULTRASOUND GUIDED DIAGNOSTIC AND THERAPEUTIC RIGHT THORACENTESIS MEDICATIONS: None. COMPLICATIONS: None immediate. PROCEDURE: Procedure, benefits, and risks of procedure were discussed with patient. Written informed consent for procedure was obtained. Time out protocol followed.  Pleural effusion localized by ultrasound at the posterior RIGHT hemithorax. Skin prepped and draped in usual sterile fashion. Skin and soft tissues anesthetized with 10 mL of 1% lidocaine. 8 French thoracentesis catheter placed into the RIGHT pleural space. 1.2 L of amber colored RIGHT pleural fluid aspirated by syringe pump. Procedure tolerated well by patient without immediate complication. FINDINGS: A total of approximately 1.2 L of RIGHT pleural fluid was removed. Samples were sent to the laboratory as requested by the clinical team. IMPRESSION: Successful ultrasound guided RIGHT thoracentesis yielding 1.2 L of RIGHT pleural fluid. Electronically Signed   By: Lavonia Dana M.D.   On: 08/11/2017 17:12    EKG: Independently reviewed.  NSR with rate 94; nonspecific ST changes with no evidence of acute ischemia   Labs on Admission: I have personally reviewed the available labs and imaging studies at the time of the admission.  Pertinent labs:   Serum LDH 139 Glucose 156 Creatinine 1.12, GFR 48 Albumin 3.1 WBC 11.3 Hgb 10.0 Pleural fluid:  Albumin 2.7 Glucose 124 LDH 162 (ratio pleural fluid:serum = 1.17) Protein 4.9 (0.66) WBC 352   Assessment/Plan Principal Problem:   Acute on chronic respiratory failure with hypoxia (HCC) Active Problems:   Normocytic anemia    Diabetes mellitus type 2 in nonobese [employer])   Essential hypertension   Pleural effusion   Tobacco dependence   Acute on chronic respiratory failure with hypoxia and large pleural effusion -Patient with many month h/o progressive SOB which has worsened over the last few weeks and markedly worsened yesterday into today -Patient had to sit upright in order to be able to breathe -Denies h/o URI symptoms, fever, or other apparent infectious etiology -She has a remote /o breast cancer and a long h/o tobacco dependence -CXR showed what appeared to be multifocal right-sided PNA with a possible effusion; there was also mention of possible mild lymphangitis spread of tumor in the left base -CT chest showed a large and loculated right pleural effusion with multi-segment collapse and right adenopathy.  There is no mention of the possible left base concerns. -Patient then had an IR-directed thoracentesis. -Pleural fluid was, as expected, consistent with an exudate.  However, this does not clearly delineate the cause since infection and malignancy are both potential causes of exudates.  1.2L was removed, gram stain negative for organisms, culture and cytology are pending. -F/u CXR shows improvement in pleural effusion without evidence of pneumothorax. -Will admit to med surg -Continue [place] O2 prn -For now, without clear evidence of the cause of the effusion, will cover with broad spectrum antibiotics (Vanc/Zosyn - test for MRSA via PCR, as this may allow for narrowing of abx coverage) -Pulmonology consult in AM - patient may need bronchoscopy if other studies do not provide clarity on diagnosis -Patient reports stopping smoking several weeks ago; ongoing cessation is crucial  DM -Hold PO meds (Glucotrol, Glucophage) -Cover with SSI (sensitive scale) -Check A1c  HTN -Continue Cozaar and HCTZ for now -Creatinine is borderline; monitor and will need to hold these medications if trending  up  Anemia -Normocytic -Uncertain baseline -Will follow  DVT prophylaxis: Lovenox - starting tomorrow AM (8/13) Code Status:  DNR - confirmed with patient/family Family Communication: Husband present throughout evaluation  Disposition Plan:  Home once clinically improved Consults called: Pulmonology  Admission status: Admit - It is my clinical opinion that admission to INPATIENT is reasonable and necessary because this patient will require at least 2 midnights in the hospital to treat this condition  based on the medical complexity of the problems presented.  Given the aforementioned information, the predictability of an adverse outcome is felt to be significant.    Karmen Bongo MD [facility]  If note is complete, please contact covering daytime or nighttime physician. [email or web address] Password [email or web address]  08/11/2017, 8:25 PM

## 2017-08-11 NOTE — ED Provider Notes (Signed)
[facility] DEPT Provider Note   CSN: [id number] Arrival date & time: 08/11/17  1231     History   Chief Complaint Chief Complaint  Patient presents with  . Shortness of Breath    HPI Kelly Gay is a 71 y.o. female.  71 year old female with a Deepak year smoking history, history of breast cancer status post resection in 1988, diabetes and hypertension presents to the emergency department with 13 days of shortness of breath and retrosternal chest pain is pleuritic in nature. Patient states is started suddenly 13 days ago and has not gone away since then. She has sometimes once better sometimes when its worse. It is definitely exacerbated by exertion and made better by rest but never totally goes away. She seen a doctor couple times for this and had breathing treatments without any improvement. No history of the same. No history of heart failure or other cardiac issues. No other associated symptoms.    Shortness of Breath     Past Medical History:  Diagnosis Date  . Breast cancer ([place]) 1998   Right nipple inversion s/p lumpectomy/resection, chemo/rads, no recurrence  . Bronchitis   . Diabetes ([place])   . HTN (hypertension)   . Hyperlipidemia     Patient Active Problem List   Diagnosis Date Noted  . Acute on chronic respiratory failure with hypoxia ([place]) 08/11/2017  . H/O carcinoma in situ of colon 06/22/2014  . GERD (gastroesophageal reflux disease) 06/22/2014  . Normocytic anemia 06/22/2014    Past Surgical History:  Procedure Laterality Date  . ABDOMINAL HYSTERECTOMY    . BREAST SURGERY     partial, right for cancer  . COLON BIOPSY  2005    Dr. Gala Romney: normal  . COLONOSCOPY  2004   Dr. Gala Romney: multiple rectosigmoid polyps, sigmoid polyp with carcinoma in situ  . COLONOSCOPY N/A 07/18/2014   Procedure: COLONOSCOPY;  Surgeon: Daneil Dolin, MD;  Location: [facility];  Service: Endoscopy;  Laterality: N/A;  10:00  . ESOPHAGOGASTRODUODENOSCOPY N/A 07/18/2014     Procedure: ESOPHAGOGASTRODUODENOSCOPY (EGD);  Surgeon: Daneil Dolin, MD;  Location: [facility];  Service: Endoscopy;  Laterality: N/A;    OB History    No data available       Home Medications    Prior to Admission medications   Medication Sig Start Date End Date Taking? Authorizing Provider  buPROPion (WELLBUTRIN XL) 150 MG 24 hr tablet Take 150 mg by mouth daily.   Yes [provider]  cetirizine (ZYRTEC) 10 MG tablet Take 10 mg by mouth daily as needed for allergies.    Yes [provider]  glipiZIDE (GLUCOTROL) 10 MG tablet Take 10 mg by mouth 2 (two) times daily.   Yes [provider]  hydrochlorothiazide (MICROZIDE) 12.5 MG capsule Take 12.5 mg by mouth daily.   Yes [provider]  ibuprofen (ADVIL,MOTRIN) 200 MG tablet Take 400 mg by mouth every 4 (four) hours as needed for headache or moderate pain.   Yes [provider]  losartan (COZAAR) 25 MG tablet Take 25 mg by mouth daily.   Yes [provider]  metFORMIN (GLUCOPHAGE) 1000 MG tablet Take 1,000 mg by mouth 2 (two) times daily.    Yes [provider]  Multiple Vitamins-Minerals (CENTRUM ULTRA WOMENS) TABS Take 1 tablet by mouth daily.    Yes [provider]  Omega-3 Fatty Acids (FISH OIL ULTRA) 1400 MG CAPS Take 1,400 mg by mouth daily.   Yes [provider]  Family History Family History  Problem Relation Age of Onset  . Throat cancer Brother   . Lung cancer Brother   . Cancer Brother        ?prostate  . COPD Mother   . Heart attack Father   . Colon cancer Neg Hx     Social History Social History  Substance Use Topics  . Smoking status: Former Smoker    Packs/day: 1.00    Years: 50.00    Types: Cigarettes  . Smokeless tobacco: Never Used     Comment: quit in July 2018  . Alcohol use No     Allergies   Incruse ellipta [umeclidinium bromide]   Review of Systems Review of Systems  Respiratory: Positive for  shortness of breath.   All other systems reviewed and are negative.    Physical Exam Updated Vital Signs BP (!) 142/80   Pulse 100   Temp 98.3 F (36.8 C)   Resp (!) 25   Ht 5\' 4"  (1.626 m)   Wt 50.8 kg (112 lb)   SpO2 99%   BMI 19.22 kg/m   Physical Exam  Constitutional: She appears well-developed and well-nourished.  HENT:  Head: Normocephalic and atraumatic.  Eyes: Conjunctivae and EOM are normal.  Neck: Normal range of motion.  Cardiovascular: Regular rhythm.  Tachycardia present.   Pulmonary/Chest: No stridor. Tachypnea noted. No respiratory distress. She has decreased breath sounds in the right lower field. She has rales (RLL) in the right lower field.  Abdominal: She exhibits no distension.  Neurological: She is alert.  Skin: Skin is warm and dry. No erythema. No pallor.  Nursing note and vitals reviewed.    ED Treatments / Results  Labs (all labs ordered are listed, but only abnormal results are displayed) Labs Reviewed  CBC WITH DIFFERENTIAL/PLATELET - Abnormal; Notable for the following:       Result Value   WBC 11.3 (*)    RBC 3.58 (*)    Hemoglobin 10.0 (*)    HCT 31.3 (*)    Neutro Abs 8.7 (*)    Monocytes Absolute 1.1 (*)    All other components within normal limits  COMPREHENSIVE METABOLIC PANEL - Abnormal; Notable for the following:    Chloride 99 (*)    Glucose, Bld 156 (*)    Creatinine, Ser 1.12 (*)    Albumin 3.1 (*)    ALT 11 (*)    GFR calc non Af Amer 48 (*)    GFR calc Af Amer 56 (*)    All other components within normal limits  D-DIMER, QUANTITATIVE (NOT AT [facility]) - Abnormal; Notable for the following:    D-Dimer, Quant 2.63 (*)    All other components within normal limits  BODY FLUID CULTURE  TROPONIN I  BRAIN NATRIURETIC PEPTIDE  ALBUMIN, PLEURAL OR PERITONEAL FLUID  BODY FLUID CELL COUNT WITH DIFFERENTIAL  GLUCOSE, PLEURAL OR PERITONEAL FLUID  LACTATE DEHYDROGENASE, PLEURAL OR PERITONEAL FLUID  TRIGLYCERIDES, BODY FLUIDS    PROTEIN, PLEURAL OR PERITONEAL FLUID  LACTATE DEHYDROGENASE  PH, BODY FLUID  CYTOLOGY - NON PAP    EKG  EKG Interpretation  Date/Time:  Monday August 11 2017 13:13:49 EDT Ventricular Rate:  94 PR Interval:    QRS Duration: 72 QT Interval:  389 QTC Calculation: 487 R Axis:   43 Text Interpretation:  Sinus rhythm Low voltage, extremity leads Anteroseptal infarct, age indeterminate No old tracing to compare Confirmed by Merrily Pew [phone]) on 08/11/2017 2:39:19 PM  Radiology Dg Chest 2 View  Result Date: 08/11/2017 CLINICAL DATA:  Shortness of breath and chest pain. History of breast carcinoma EXAM: CHEST  2 VIEW COMPARISON:  Chest CT October 02, 2009 FINDINGS: There is extensive airspace consolidation throughout the right middle and lower lobes. There is also consolidation in the inferior aspect of the anterior segment right upper lobe. There may be a degree of intermixed pleural fluid. The left lung is clear except for trace left base interstitial edema. Heart size and pulmonary vascularity are normal. No evident adenopathy. No bone lesions. IMPRESSION: Widespread airspace consolidation consistent with multifocal pneumonia on the right. There may be a degree of pleural effusion intermixed with the consolidation. Trace interstitial edema left base. The possibility of mild lymphangitic spread of tumor in the left base must be of concern given history of breast carcinoma. Cardiac silhouette within normal limits. Electronically Signed   By: Lowella Grip III M.D.   On: 08/11/2017 13:39   Ct Angio Chest Pe W And/or Wo Contrast  Result Date: 08/11/2017 CLINICAL DATA:  Shortness of breath for 13 days. History breast cancer. EXAM: CT ANGIOGRAPHY CHEST WITH CONTRAST TECHNIQUE: Multidetector CT imaging of the chest was performed using the standard protocol during bolus administration of intravenous contrast. Multiplanar CT image reconstructions and MIPs were obtained to evaluate the  vascular anatomy. CONTRAST:  100 cc Isovue 370 intravenous COMPARISON:  10/02/2009 FINDINGS: Cardiovascular: Satisfactory opacification of the pulmonary arteries to the segmental level. No evidence of pulmonary embolism. Normal heart size. No pericardial effusion. Atherosclerotic calcifications seen along the aorta and left coronaries. Mediastinum/Nodes: Mild enlargement of the right juxta diaphragmatic and hilar lymph nodes, nonspecific in this setting. No generalized adenopathy. There is a right tracheoesophageal groove thyroid nodule that is stable if not decreased from 2010 CT. Lungs/Pleura: Large right pleural effusion with loculation. Except for the superior segment, the right lower lobe is collapsed with scalloping. There is also collapse and distortion of the right middle lobe. The aerated right lung is free of pneumonia or nodularity. Emphysema, primarily paraseptal. There is likely some pleural thickening posteriorly and medially on the right. No discrete mass is seen. Small left lateral costophrenic sulcus nodule is stable from 2010 and benign. Upper Abdomen: No acute finding Musculoskeletal: Remote and healed proximal right humerus fracture. Review of the MIP images confirms the above findings. IMPRESSION: 1. Large and loculated right pleural effusion with multi segment collapse. There are areas of mild pleural thickening without discrete mass lesion. 2. Mild right hilar and juxta diaphragmatic adenopathy, significance to be determined based on thoracentesis. 3. Negative for pulmonary embolism. 4.  Aortic Atherosclerosis (ICD10-I70.0). 5.  Emphysema (ICD10-J43.9). Electronically Signed   By: Monte Fantasia M.D.   On: 08/11/2017 14:52    Procedures Procedures (including critical care time)  Medications Ordered in ED Medications  azithromycin (ZITHROMAX) 500 mg in dextrose 5 % 250 mL IVPB (500 mg Intravenous New Bag/Given 08/11/17 1602)  LORazepam (ATIVAN) injection 0.5 mg (not administered)    iopamidol (ISOVUE-370) 76 % injection 100 mL (100 mLs Intravenous Contrast Given 08/11/17 1428)  cefTRIAXone (ROCEPHIN) 1 g in dextrose 5 % 50 mL IVPB (0 g Intravenous Stopped 08/11/17 1556)     Initial Impression / Assessment and Plan / ED Course  I have reviewed the triage vital signs and the nursing notes.  Pertinent labs & imaging results that were available during my care of the patient were reviewed by me and considered in my medical decision making (see chart for  details).     Patient with large right-sided pleural effusion versus consolidation on x-ray and CT. Started on community-acquired antibiotics as some of it does appear to be loculated. Also discussed with hospitalist who will admit for further workup and to follow-up thoracentesis labs.   Final Clinical Impressions(s) / ED Diagnoses   Final diagnoses:  Dyspnea      Emmani Lesueur, Corene Cornea, MD 08/11/17 [phone]

## 2017-08-11 NOTE — ED Notes (Signed)
Pt in Thoracentesis.

## 2017-08-11 NOTE — ED Notes (Signed)
Pt reports difficulty breathing,  o2 sat 93% on room air, placed pt on 02 at 2 liters.

## 2017-08-11 NOTE — ED Notes (Signed)
Pt ambulatory to restroom. C/o shortness of breath and chest pain upon arrival back to room. Notified RN.

## 2017-08-11 NOTE — Procedures (Signed)
PreOperative Dx: RIGHT pleural effusion Postoperative Dx: RIGHT pleural effusion Procedure:   US guided RIGHT thoracentesis Radiologist:  Thornton Papas Anesthesia:  10 ml of 1% lidocaine Specimen:  1.2 L of amber colored fluid EBL:   < 1 ml Complications: None

## 2017-08-12 ENCOUNTER — Encounter (HOSPITAL_COMMUNITY): Payer: Self-pay | Admitting: Internal Medicine

## 2017-08-12 LAB — GLUCOSE, CAPILLARY
GLUCOSE-CAPILLARY: 135 mg/dL — AB (ref 65–99)
Glucose-Capillary: 203 mg/dL — ABNORMAL HIGH (ref 65–99)
Glucose-Capillary: 107 mg/dL — ABNORMAL HIGH (ref 65–99)
Glucose-Capillary: 223 mg/dL — ABNORMAL HIGH (ref 65–99)

## 2017-08-12 LAB — PH, BODY FLUID: PH, BODY FLUID: 7.9

## 2017-08-12 LAB — BASIC METABOLIC PANEL
Anion gap: 10 (ref 5–15)
BUN: 19 mg/dL (ref 6–20)
CO2: 26 mmol/L (ref 22–32)
CREATININE: 1.18 mg/dL — AB (ref 0.44–1.00)
Calcium: 8.6 mg/dL — ABNORMAL LOW (ref 8.9–10.3)
Chloride: 98 mmol/L — ABNORMAL LOW (ref 101–111)
GFR, EST AFRICAN AMERICAN: 52 mL/min — AB (ref >60)
GFR, EST NON AFRICAN AMERICAN: 45 mL/min — AB (ref >60)
Glucose, Bld: 130 mg/dL — ABNORMAL HIGH (ref 65–99)
POTASSIUM: 3.3 mmol/L — AB (ref 3.5–5.1)
SODIUM: 134 mmol/L — AB (ref 135–145)

## 2017-08-12 LAB — CBC
HCT: 29.7 % — ABNORMAL LOW (ref 36.0–46.0)
Hemoglobin: 9.7 g/dL — ABNORMAL LOW (ref 12.0–15.0)
MCH: 28.7 pg (ref 26.0–34.0)
MCHC: 32.7 g/dL (ref 30.0–36.0)
MCV: 87.9 fL (ref 78.0–100.0)
Platelets: 351 K/uL (ref 150–400)
RBC: 3.38 MIL/uL — ABNORMAL LOW (ref 3.87–5.11)
RDW: 15.2 % (ref 11.5–15.5)
WBC: 9.6 K/uL (ref 4.0–10.5)

## 2017-08-12 LAB — HEMOGLOBIN A1C
Hgb A1c MFr Bld: 6.7 % — ABNORMAL HIGH (ref 4.8–5.6)
Mean Plasma Glucose: 146 mg/dL

## 2017-08-12 LAB — TRIGLYCERIDES, BODY FLUIDS: Triglycerides, Fluid: 29 mg/dL

## 2017-08-12 MED ORDER — MORPHINE SULFATE (PF) 4 MG/ML IV SOLN
2.0000 mg | INTRAVENOUS | Status: DC | PRN
  Administered 2017-08-18: 2 mg via INTRAVENOUS
  Filled 2017-08-12: qty 1

## 2017-08-12 NOTE — Progress Notes (Signed)
Called report to Maye Hides, [facility] [place] nurse. Awaiting [facility]link to pick up patient.

## 2017-08-12 NOTE — Progress Notes (Signed)
[facility] transferring patient to [facility]. They are en route.

## 2017-08-12 NOTE — Progress Notes (Signed)
PROGRESS NOTE    Kelly Gay  JQB:[id number] DOB: 12/23/1946 DOA: 08/11/2017 PCP: [facility]     Brief Narrative:  71 year old woman admitted from home on 8/13 with complaints of shortness of breath. She has a remote history of breast cancer, tobacco abuse. She has been short of breath for about 2 weeks but today became acutely worse. Presented to the hospital where chest x-ray showed widespread airspace consolidation consistent with multifocal pneumonia on the right with possibility of lymphangitic spread of tumor in the left base. Subsequent CT chest showed a large loculated right pleural effusion with multisegment collapse. She underwent a thoracentesis yielding 1.2 L of fluid. Fluid studies are pending. Shortness of breath has improved.   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 -Potential etiologies include pneumonia plus minus recurrence of breast malignancy. -Cytology from thoracentesis is pending. -Dr. Luan Pulling has discussed with thoracic surgery in [facility] and decision has been made to transfer for potential VATS. -Wean oxygen as tolerated, although patient still appears mildly short of breath today. -Is currently on broad-spectrum antibiotics including vancomycin, Zosyn, azithromycin. -MRSA PCR is negative, will discontinue vancomycin.  Tobacco abuse -Counseled on cessation   DVT prophylaxis: Lovenox Code Status: DO NOT RESUSCITATE Family Communication: Husband at bedside updated on plan of care and questions answered Disposition Plan: Transfer to [facility] for thoracic surgery consultation.  Consultants:   Pulmonary  Cardiothoracic  surgery  Procedures:   None  Antimicrobials:  Anti-infectives    Start     Dose/Rate Route Frequency Ordered Stop   08/12/17 1800  vancomycin (VANCOCIN) IVPB 750 mg/150 ml premix     750 mg 150 mL/hr over 60 Minutes Intravenous Every 24 hours 08/11/17 2056     08/11/17 2200  piperacillin-tazobactam (ZOSYN) IVPB 3.375 g     3.375 g 12.5 mL/hr over 240 Minutes Intravenous Every 8 hours 08/11/17 2054     08/11/17 2100  vancomycin (VANCOCIN) IVPB 1000 mg/200 mL premix     1,000 mg 200 mL/hr over 60 Minutes Intravenous  Once 08/11/17 2053 08/11/17 2317   08/11/17 1500  cefTRIAXone (ROCEPHIN) 1 g in dextrose 5 % 50 mL IVPB     1 g 100 mL/hr over 30 Minutes Intravenous  Once 08/11/17 1458 08/11/17 1556   08/11/17 1500  azithromycin (ZITHROMAX) 500 mg in dextrose 5 % 250 mL IVPB     500 mg 250 mL/hr over 60 Minutes Intravenous  Once 08/11/17 1458 08/11/17 1702       Subjective: Still feels short of breath, mild increased work of breathing noted  Objective: Vitals:   08/12/17 0449 08/12/17 0949 08/12/17 1353 08/12/17 1442  BP: (!) 99/57 (!) 101/56 110/61   Pulse: 93 90 92   Resp: 14  18   Temp: 98.3 F (36.8 C)  97.8 F (36.6 C)   TempSrc: Oral  Oral   SpO2: 97%  98% 94%  Weight:      Height:        Intake/Output Summary (Last 24 hours) at 08/12/17 1633 Last data filed at 08/12/17 0830  Gross per 24 hour  Intake              490  ml  Output              325 ml  Net              165 ml   Filed Weights   08/11/17 1237  Weight: 50.8 kg (112 lb)    Examination:  General exam: Alert, awake, oriented x 3 Respiratory system: Decreased breath sounds right mid lung fields down. Cardiovascular system:RRR. No murmurs, rubs, gallops. Gastrointestinal system: Abdomen is nondistended, soft and nontender. No organomegaly or masses felt. Normal bowel sounds heard. Central nervous system: Alert and oriented. No focal neurological deficits. Extremities: No C/C/E, +pedal  pulses Skin: No rashes, lesions or ulcers Psychiatry: Judgement and insight appear normal. Mood & affect appropriate.     Data Reviewed: I have personally reviewed following labs and imaging studies  CBC:  Recent Labs Lab 08/11/17 1246 08/12/17 0542  WBC 11.3* 9.6  NEUTROABS 8.7*  --   HGB 10.0* 9.7*  HCT 31.3* 29.7*  MCV 87.4 87.9  PLT 356 761   Basic Metabolic Panel:  Recent Labs Lab 08/11/17 1246 08/12/17 0542  NA 135 134*  K 3.5 3.3*  CL 99* 98*  CO2 25 26  GLUCOSE 156* 130*  BUN 20 19  CREATININE 1.12* 1.18*  CALCIUM 8.9 8.6*   GFR: Estimated Creatinine Clearance: 35.1 mL/min (A) (by C-G formula based on SCr of 1.18 mg/dL (H)). Liver Function Tests:  Recent Labs Lab 08/11/17 1246  AST 15  ALT 11*  ALKPHOS 46  BILITOT 0.5  PROT 7.4  ALBUMIN 3.1*   No results for input(s): LIPASE, AMYLASE in the last 168 hours. No results for input(s): AMMONIA in the last 168 hours. Coagulation Profile: No results for input(s): INR, PROTIME in the last 168 hours. Cardiac Enzymes:  Recent Labs Lab 08/11/17 1246  TROPONINI <0.03   BNP (last 3 results) No results for input(s): PROBNP in the last 8760 hours. HbA1C:  Recent Labs  08/11/17 1803  HGBA1C 6.7*   CBG:  Recent Labs Lab 08/08/17 0847 08/11/17 2106 08/12/17 0733 08/12/17 1132 08/12/17 1558  GLUCAP 146* 157* 135* 203* 107*   Lipid Profile: No results for input(s): CHOL, HDL, LDLCALC, TRIG, CHOLHDL, LDLDIRECT in the last 72 hours. Thyroid Function Tests: No results for input(s): TSH, T4TOTAL, FREET4, T3FREE, THYROIDAB in the last 72 hours. Anemia Panel: No results for input(s): VITAMINB12, FOLATE, FERRITIN, TIBC, IRON, RETICCTPCT in the last 72 hours. Urine analysis: No results found for: COLORURINE, APPEARANCEUR, LABSPEC, [place], GLUCOSEU, [place], [place], [place], PROTEINUR, UROBILINOGEN, NITRITE, LEUKOCYTESUR Sepsis Labs: @LABRCNTIP (procalcitonin:4,lacticidven:4)  ) Recent  Results (from the past 240 hour(s))  Culture, body fluid-bottle     Status: None (Preliminary result)   Collection Time: 08/11/17  5:00 PM  Result Value Ref Range Status   Specimen Description PLEURAL COLLECTED BY DOCTOR  Final   Special Requests BOTTLES DRAWN AEROBIC AND ANAEROBIC 10 CC EACH  Final   Culture NO GROWTH < 24 HOURS  Final   Report Status PENDING  Incomplete  Gram stain     Status: None   Collection Time: 08/11/17  5:00 PM  Result Value Ref Range Status   Specimen Description PLEURAL  Final   Special Requests NONE  Final   Gram Stain   Final    Performed at [facility] WBC PRESENT, PREDOMINANTLY PMN    Report Status 08/11/2017 FINAL  Final  MRSA PCR Screening     Status: None   Collection Time: 08/11/17  8:33 PM  Result Value Ref Range Status   MRSA by PCR NEGATIVE NEGATIVE Final    Comment:        The GeneXpert MRSA Assay (FDA approved for NASAL specimens only), is one component of a comprehensive MRSA colonization surveillance program. It is not intended to diagnose MRSA infection nor to guide or monitor treatment for MRSA infections.          Radiology Studies: Dg Chest 1 View  Result Date: 08/11/2017 CLINICAL DATA:  Post RIGHT thoracentesis EXAM: CHEST 1 VIEW COMPARISON:  Earlier exam of 08/11/2017 FINDINGS: Normal heart size mediastinal contours. Atherosclerotic calcification aorta. Slight decrease in RIGHT pleural effusion and basilar atelectasis post thoracentesis removing 1.2 L of fluid. No pneumothorax. LEFT lung appears emphysematous but clear. Bones demineralized. IMPRESSION: Decrease in RIGHT pleural effusion and basilar atelectasis post thoracentesis without evidence of pneumothorax. Electronically Signed   By: Lavonia Dana M.D.   On: 08/11/2017 17:14   Dg Chest 2 View  Result Date: 08/11/2017 CLINICAL DATA:  Shortness of breath and chest pain. History of breast carcinoma EXAM: CHEST  2 VIEW COMPARISON:  Chest CT  October 02, 2009 FINDINGS: There is extensive airspace consolidation throughout the right middle and lower lobes. There is also consolidation in the inferior aspect of the anterior segment right upper lobe. There may be a degree of intermixed pleural fluid. The left lung is clear except for trace left base interstitial edema. Heart size and pulmonary vascularity are normal. No evident adenopathy. No bone lesions. IMPRESSION: Widespread airspace consolidation consistent with multifocal pneumonia on the right. There may be a degree of pleural effusion intermixed with the consolidation. Trace interstitial edema left base. The possibility of mild lymphangitic spread of tumor in the left base must be of concern given history of breast carcinoma. Cardiac silhouette within normal limits. Electronically Signed   By: Lowella Grip III M.D.   On: 08/11/2017 13:39   Ct Angio Chest Pe W And/or Wo Contrast  Result Date: 08/11/2017 CLINICAL DATA:  Shortness of breath for 13 days. History breast cancer. EXAM: CT ANGIOGRAPHY CHEST WITH CONTRAST TECHNIQUE: Multidetector CT imaging of the chest was performed using the standard protocol during bolus administration of intravenous contrast. Multiplanar CT image reconstructions and MIPs were obtained to evaluate the vascular anatomy. CONTRAST:  100 cc Isovue 370 intravenous COMPARISON:  10/02/2009 FINDINGS: Cardiovascular: Satisfactory opacification of the pulmonary arteries to the segmental level. No evidence of pulmonary embolism. Normal heart size. No pericardial effusion. Atherosclerotic calcifications seen along the aorta and left coronaries. Mediastinum/Nodes: Mild enlargement of the right juxta diaphragmatic and hilar lymph nodes, nonspecific in this setting. No generalized adenopathy. There is a right tracheoesophageal groove thyroid nodule that is stable if not decreased from 2010 CT. Lungs/Pleura: Large right pleural effusion with loculation. Except for the superior  segment, the right lower lobe is collapsed with scalloping. There is also collapse and distortion of the right middle lobe. The aerated right lung is free of pneumonia or nodularity. Emphysema, primarily paraseptal. There is likely some pleural thickening posteriorly and medially on the right. No discrete mass is seen. Small left lateral costophrenic sulcus nodule is stable from 2010 and benign. Upper Abdomen: No acute finding Musculoskeletal: Remote and healed proximal right humerus fracture. Review of the MIP images confirms the above findings. IMPRESSION: 1. Large and loculated right pleural effusion with multi segment collapse. There are areas of mild pleural thickening without discrete mass lesion. 2. Mild right hilar and juxta diaphragmatic adenopathy, significance  to be determined based on thoracentesis. 3. Negative for pulmonary embolism. 4.  Aortic Atherosclerosis (ICD10-I70.0). 5.  Emphysema (ICD10-J43.9). Electronically Signed   By: Monte Fantasia M.D.   On: 08/11/2017 14:52   US Thoracentesis Asp Pleural Space W/img Guide  Result Date: 08/11/2017 INDICATION: RIGHT pleural effusion EXAM: ULTRASOUND GUIDED DIAGNOSTIC AND THERAPEUTIC RIGHT THORACENTESIS MEDICATIONS: None. COMPLICATIONS: None immediate. PROCEDURE: Procedure, benefits, and risks of procedure were discussed with patient. Written informed consent for procedure was obtained. Time out protocol followed. Pleural effusion localized by ultrasound at the posterior RIGHT hemithorax. Skin prepped and draped in usual sterile fashion. Skin and soft tissues anesthetized with 10 mL of 1% lidocaine. 8 French thoracentesis catheter placed into the RIGHT pleural space. 1.2 L of amber colored RIGHT pleural fluid aspirated by syringe pump. Procedure tolerated well by patient without immediate complication. FINDINGS: A total of approximately 1.2 L of RIGHT pleural fluid was removed. Samples were sent to the laboratory as requested by the clinical team.  IMPRESSION: Successful ultrasound guided RIGHT thoracentesis yielding 1.2 L of RIGHT pleural fluid. Electronically Signed   By: Lavonia Dana M.D.   On: 08/11/2017 17:12        Scheduled Meds: . buPROPion  150 mg Oral Daily  . docusate sodium  100 mg Oral BID  . enoxaparin (LOVENOX) injection  40 mg Subcutaneous Q24H  . hydrochlorothiazide  12.5 mg Oral Daily  . insulin aspart  0-5 Units Subcutaneous QHS  . insulin aspart  0-9 Units Subcutaneous TID WC  . loratadine  10 mg Oral Daily  . losartan  25 mg Oral Daily  . multivitamin with minerals  1 tablet Oral Daily  . omega-3 acid ethyl esters  1 capsule Oral Daily   Continuous Infusions: . piperacillin-tazobactam (ZOSYN)  IV 3.375 g (08/12/17 1344)  . vancomycin       LOS: 1 day    Time spent: 35 minutes. Greater than 50% of this time was spent in direct contact with the patient coordinating care.     Lelon Frohlich, MD [facility] Pager [phone]  If 7PM-7AM, please contact night-coverage [email or web address] Password [facility] 08/12/2017, 4:33 PM

## 2017-08-12 NOTE — Progress Notes (Signed)
1830 Received pt from [facility] via [place]. Pt is A&O x4. Mild SOB at rest, on O2 at 2L per nasal cannula. Denies pain. Call light w/in reach.

## 2017-08-12 NOTE — Progress Notes (Signed)
Pharmacy Antibiotic Note  Kelly Gay is a 71 y.o. female admitted on 08/11/2017 with pneumonia.  Pharmacy has been consulted for [place] dosing.  Plan:  Vancomycin 1000mg  x 1 then 750mg  IV q24h (renally adjusted) Check trough at steady state Zosyn 3.375gm IV q8h, EID Monitor labs, renal fxn, progress and c/s De[place]calate ABX when improved / appropriate.    Height: 5\' 4"  (162.6 cm) Weight: 112 lb (50.8 kg) IBW/kg (Calculated) : 54.7  Temp (24hrs), Avg:98.5 F (36.9 C), Min:98.2 F (36.8 C), Max:99.3 F (37.4 C)   Recent Labs Lab 08/11/17 1246 08/12/17 0542  WBC 11.3* 9.6  CREATININE 1.12* 1.18*    Estimated Creatinine Clearance: 35.1 mL/min (A) (by C-G formula based on SCr of 1.18 mg/dL (H)).    Allergies  Allergen Reactions  . Incruse Ellipta [Umeclidinium Bromide]     Anxiety    Antimicrobials this admission: Vancomycin 8/13 >>  Zosyn 8/13 >>   Dose adjustments this admission:  Microbiology results:  BCx: pending  UCx: pending   Sputum:    MRSA PCR: negative  Thank you for allowing pharmacy to be a part of this patient's care.  Hart Robinsons A 08/12/2017 11:19 AM

## 2017-08-12 NOTE — Consult Note (Signed)
Consult requested by: [facility] hospitalist Consult requested for: Pleural effusion  HPI: This is a 71 year old had increasing shortness of breath over the last 2 weeks and to some extent over the last 3 months. She eventually got to where she had to sit up to breathe came to the emergency department and was found to have a large right pleural effusion which is loculated. She does have a history of breast cancer in the past but no evidence of recurrence thus far. She had thoracentesis with removal of 1.2 L yesterday and this looks like it's exudative. Culture and cytology are still pending. She says she feels better. She's not had any chest pain. She's coughing a little bit. No nausea or vomiting. No fever or chills.  Past Medical History:  Diagnosis Date  . Breast cancer ([place]) 1998   Right nipple inversion s/p lumpectomy/resection, chemo/rads, no recurrence  . Bronchitis   . Diabetes ([place])   . HTN (hypertension)   . Hyperlipidemia      Family History  Problem Relation Age of Onset  . Throat cancer Brother   . Lung cancer Brother   . Cancer Brother        ?prostate  . COPD Mother   . Heart attack Father   . Colon cancer Neg Hx      Social History   Social History  . Marital status: Married    Spouse name: N/A  . Number of children: 2  . Years of education: N/A   Occupational History  . Retired    Social History Main Topics  . Smoking status: Former Smoker    Packs/day: 1.00    Years: 50.00    Types: Cigarettes  . Smokeless tobacco: Never Used     Comment: quit in July 2018  . Alcohol use No  . Drug use: No  . Sexual activity: Yes    Birth control/ protection: Surgical   Other Topics Concern  . None   Social History Narrative  . None     ROS: Except as mentioned 10 point review of systems is negative    Objective: Vital signs in last 24 hours: Temp:  [98.2 F (36.8 C)-99.3 F (37.4 C)] 98.3 F (36.8 C) (08/14 0449) Pulse Rate:  [91-108] 93 (08/14  0449) Resp:  [14-32] 14 (08/14 0449) BP: (99-157)/(57-89) 99/57 (08/14 0449) SpO2:  [94 %-100 %] 97 % (08/14 0449) Weight:  [50.8 kg (112 lb)] 50.8 kg (112 lb) (08/13 1237) Weight change:  Last BM Date: 08/09/17  Intake/Output from previous day: 08/13 0701 - 08/14 0700 In: 540 [P.O.:240; IV Piggyback:300] Out: 200 [Urine:200]  PHYSICAL EXAM Constitutional: She is awake and alert and in no acute distress. Eyes: Pupils react. Ears nose mouth and throat: Throat is clear. Mucous membranes are moist. Hearing is grossly normal. Cardiovascular: Her heart is regular with normal heart sounds. Respiratory: She still has markedly decreased breath sounds on the right left lung is clear. Gastrointestinal: Her abdomen is soft with no masses. Skin: Warm and dry. Neurological: No focal abnormalities. Psychiatric: Normal mood and affect  Lab Results: Basic Metabolic Panel:  Recent Labs  08/11/17 1246 08/12/17 0542  NA 135 134*  K 3.5 3.3*  CL 99* 98*  CO2 25 26  GLUCOSE 156* 130*  BUN 20 19  CREATININE 1.12* 1.18*  CALCIUM 8.9 8.6*   Liver Function Tests:  Recent Labs  08/11/17 1246  AST 15  ALT 11*  ALKPHOS 46  BILITOT 0.5  PROT 7.4  ALBUMIN 3.1*   No results for input(s): LIPASE, AMYLASE in the last 72 hours. No results for input(s): AMMONIA in the last 72 hours. CBC:  Recent Labs  08/11/17 1246 08/12/17 0542  WBC 11.3* 9.6  NEUTROABS 8.7*  --   HGB 10.0* 9.7*  HCT 31.3* 29.7*  MCV 87.4 87.9  PLT 356 351   Cardiac Enzymes:  Recent Labs  08/11/17 1246  TROPONINI <0.03   BNP: No results for input(s): PROBNP in the last 72 hours. D-Dimer:  Recent Labs  08/11/17 1246  DDIMER 2.63*   CBG:  Recent Labs  08/11/17 2106 08/12/17 0733  GLUCAP 157* 135*   Hemoglobin A1C:  Recent Labs  08/11/17 1803  HGBA1C 6.7*   Fasting Lipid Panel: No results for input(s): CHOL, HDL, LDLCALC, TRIG, CHOLHDL, LDLDIRECT in the last 72 hours. Thyroid Function Tests: No  results for input(s): TSH, T4TOTAL, FREET4, T3FREE, THYROIDAB in the last 72 hours. Anemia Panel: No results for input(s): VITAMINB12, FOLATE, FERRITIN, TIBC, IRON, RETICCTPCT in the last 72 hours. Coagulation: No results for input(s): LABPROT, INR in the last 72 hours. Urine Drug Screen: Drugs of Abuse  No results found for: LABOPIA, COCAINSCRNUR, LABBENZ, AMPHETMU, THCU, LABBARB  Alcohol Level: No results for input(s): ETH in the last 72 hours. Urinalysis: No results for input(s): COLORURINE, LABSPEC, PHURINE, GLUCOSEU, HGBUR, BILIRUBINUR, KETONESUR, PROTEINUR, UROBILINOGEN, NITRITE, LEUKOCYTESUR in the last 72 hours.  Invalid input(s): APPERANCEUR Misc. Labs:   ABGS: No results for input(s): PHART, PO2ART, TCO2, HCO3 in the last 72 hours.  Invalid input(s): PCO2   MICROBIOLOGY: Recent Results (from the past 240 hour(s))  Culture, body fluid-bottle     Status: None (Preliminary result)   Collection Time: 08/11/17  5:00 PM  Result Value Ref Range Status   Specimen Description PLEURAL COLLECTED BY DOCTOR  Final   Special Requests BOTTLES DRAWN AEROBIC AND ANAEROBIC 10 CC EACH  Final   Culture NO GROWTH < 24 HOURS  Final   Report Status PENDING  Incomplete  Gram stain     Status: None   Collection Time: 08/11/17  5:00 PM  Result Value Ref Range Status   Specimen Description PLEURAL  Final   Special Requests NONE  Final   Gram Stain   Final    Performed at [facility] WBC PRESENT, PREDOMINANTLY PMN    Report Status 08/11/2017 FINAL  Final  MRSA PCR Screening     Status: None   Collection Time: 08/11/17  8:33 PM  Result Value Ref Range Status   MRSA by PCR NEGATIVE NEGATIVE Final    Comment:        The GeneXpert MRSA Assay (FDA approved for NASAL specimens only), is one component of a comprehensive MRSA colonization surveillance program. It is not intended to diagnose MRSA infection nor to guide or monitor treatment for MRSA infections.      Studies/Results: Dg Chest 1 View  Result Date: 08/11/2017 CLINICAL DATA:  Post RIGHT thoracentesis EXAM: CHEST 1 VIEW COMPARISON:  Earlier exam of 08/11/2017 FINDINGS: Normal heart size mediastinal contours. Atherosclerotic calcification aorta. Slight decrease in RIGHT pleural effusion and basilar atelectasis post thoracentesis removing 1.2 L of fluid. No pneumothorax. LEFT lung appears emphysematous but clear. Bones demineralized. IMPRESSION: Decrease in RIGHT pleural effusion and basilar atelectasis post thoracentesis without evidence of pneumothorax. Electronically Signed   By: Lavonia Dana M.D.   On: 08/11/2017 17:14   Dg Chest 2 View  Result Date: 08/11/2017 CLINICAL DATA:  Shortness of breath and chest pain. History of breast carcinoma EXAM: CHEST  2 VIEW COMPARISON:  Chest CT October 02, 2009 FINDINGS: There is extensive airspace consolidation throughout the right middle and lower lobes. There is also consolidation in the inferior aspect of the anterior segment right upper lobe. There may be a degree of intermixed pleural fluid. The left lung is clear except for trace left base interstitial edema. Heart size and pulmonary vascularity are normal. No evident adenopathy. No bone lesions. IMPRESSION: Widespread airspace consolidation consistent with multifocal pneumonia on the right. There may be a degree of pleural effusion intermixed with the consolidation. Trace interstitial edema left base. The possibility of mild lymphangitic spread of tumor in the left base must be of concern given history of breast carcinoma. Cardiac silhouette within normal limits. Electronically Signed   By: Lowella Grip III M.D.   On: 08/11/2017 13:39   Ct Angio Chest Pe W And/or Wo Contrast  Result Date: 08/11/2017 CLINICAL DATA:  Shortness of breath for 13 days. History breast cancer. EXAM: CT ANGIOGRAPHY CHEST WITH CONTRAST TECHNIQUE: Multidetector CT imaging of the chest was performed using the standard protocol  during bolus administration of intravenous contrast. Multiplanar CT image reconstructions and MIPs were obtained to evaluate the vascular anatomy. CONTRAST:  100 cc Isovue 370 intravenous COMPARISON:  10/02/2009 FINDINGS: Cardiovascular: Satisfactory opacification of the pulmonary arteries to the segmental level. No evidence of pulmonary embolism. Normal heart size. No pericardial effusion. Atherosclerotic calcifications seen along the aorta and left coronaries. Mediastinum/Nodes: Mild enlargement of the right juxta diaphragmatic and hilar lymph nodes, nonspecific in this setting. No generalized adenopathy. There is a right tracheoesophageal groove thyroid nodule that is stable if not decreased from 2010 CT. Lungs/Pleura: Large right pleural effusion with loculation. Except for the superior segment, the right lower lobe is collapsed with scalloping. There is also collapse and distortion of the right middle lobe. The aerated right lung is free of pneumonia or nodularity. Emphysema, primarily paraseptal. There is likely some pleural thickening posteriorly and medially on the right. No discrete mass is seen. Small left lateral costophrenic sulcus nodule is stable from 2010 and benign. Upper Abdomen: No acute finding Musculoskeletal: Remote and healed proximal right humerus fracture. Review of the MIP images confirms the above findings. IMPRESSION: 1. Large and loculated right pleural effusion with multi segment collapse. There are areas of mild pleural thickening without discrete mass lesion. 2. Mild right hilar and juxta diaphragmatic adenopathy, significance to be determined based on thoracentesis. 3. Negative for pulmonary embolism. 4.  Aortic Atherosclerosis (ICD10-I70.0). 5.  Emphysema (ICD10-J43.9). Electronically Signed   By: Monte Fantasia M.D.   On: 08/11/2017 14:52   US Thoracentesis Asp Pleural Space W/img Guide  Result Date: 08/11/2017 INDICATION: RIGHT pleural effusion EXAM: ULTRASOUND GUIDED  DIAGNOSTIC AND THERAPEUTIC RIGHT THORACENTESIS MEDICATIONS: None. COMPLICATIONS: None immediate. PROCEDURE: Procedure, benefits, and risks of procedure were discussed with patient. Written informed consent for procedure was obtained. Time out protocol followed. Pleural effusion localized by ultrasound at the posterior RIGHT hemithorax. Skin prepped and draped in usual sterile fashion. Skin and soft tissues anesthetized with 10 mL of 1% lidocaine. 8 French thoracentesis catheter placed into the RIGHT pleural space. 1.2 L of amber colored RIGHT pleural fluid aspirated by syringe pump. Procedure tolerated well by patient without immediate complication. FINDINGS: A total of approximately 1.2 L of RIGHT pleural fluid was removed. Samples were sent to the laboratory as requested by the clinical team. IMPRESSION: Successful ultrasound guided RIGHT thoracentesis  yielding 1.2 L of RIGHT pleural fluid. Electronically Signed   By: Lavonia Dana M.D.   On: 08/11/2017 17:12    Medications:  Prior to Admission:  Prescriptions Prior to Admission  Medication Sig Dispense Refill Last Dose  . buPROPion (WELLBUTRIN XL) 150 MG 24 hr tablet Take 150 mg by mouth daily.   08/10/2017 at Unknown time  . cetirizine (ZYRTEC) 10 MG tablet Take 10 mg by mouth daily as needed for allergies.    unknown  . glipiZIDE (GLUCOTROL) 10 MG tablet Take 10 mg by mouth 2 (two) times daily.   08/10/2017 at Unknown time  . hydrochlorothiazide (MICROZIDE) 12.5 MG capsule Take 12.5 mg by mouth daily.   08/10/2017 at Unknown time  . ibuprofen (ADVIL,MOTRIN) 200 MG tablet Take 400 mg by mouth every 4 (four) hours as needed for headache or moderate pain.   unknown  . losartan (COZAAR) 25 MG tablet Take 25 mg by mouth daily.   08/10/2017 at Unknown time  . metFORMIN (GLUCOPHAGE) 1000 MG tablet Take 1,000 mg by mouth 2 (two) times daily.    08/10/2017 at Unknown time  . Multiple Vitamins-Minerals (CENTRUM ULTRA WOMENS) TABS Take 1 tablet by mouth daily.     08/10/2017 at Unknown time  . Omega-3 Fatty Acids (FISH OIL ULTRA) 1400 MG CAPS Take 1,400 mg by mouth daily.   08/10/2017 at Unknown time   Scheduled: . buPROPion  150 mg Oral Daily  . docusate sodium  100 mg Oral BID  . enoxaparin (LOVENOX) injection  40 mg Subcutaneous Q24H  . hydrochlorothiazide  12.5 mg Oral Daily  . insulin aspart  0-5 Units Subcutaneous QHS  . insulin aspart  0-9 Units Subcutaneous TID WC  . loratadine  10 mg Oral Daily  . losartan  25 mg Oral Daily  . multivitamin with minerals  1 tablet Oral Daily  . omega-3 acid ethyl esters  1 capsule Oral Daily   Continuous: . piperacillin-tazobactam (ZOSYN)  IV 3.375 g (08/12/17 0509)  . vancomycin     KZL:[id number] **OR** acetaminophen, morphine injection, ondansetron **OR** ondansetron (ZOFRAN) IV  Assesment: She has acute on chronic hypoxic respiratory failure. She has a large pleural effusion. I'm concerned that this is malignancy. Discussed with thoracic surgery by telephone and options would be to transfer her to hospitalist service in [place] and consult them for potential VATS versus waiting for cytology to come back and if this is malignancy oncology consultation etc. I don't think bronchoscopy will be helpful at this time Principal Problem:   Acute on chronic respiratory failure with hypoxia ([place]) Active Problems:   Normocytic anemia   Diabetes mellitus type 2 in nonobese [facility])   Essential hypertension   Pleural effusion   Tobacco dependence    Plan: Continue treatments. Will discuss with Dr. Jerilee Hoh    LOS: 1 day   Rolland Steinert L 08/12/2017, 8:31 AM

## 2017-08-13 DIAGNOSIS — J90 Pleural effusion, not elsewhere classified: Secondary | ICD-10-CM

## 2017-08-13 LAB — GLUCOSE, CAPILLARY
GLUCOSE-CAPILLARY: 146 mg/dL — AB (ref 65–99)
Glucose-Capillary: 147 mg/dL — ABNORMAL HIGH (ref 65–99)
Glucose-Capillary: 284 mg/dL — ABNORMAL HIGH (ref 65–99)

## 2017-08-13 NOTE — Progress Notes (Signed)
  Subjective: Patient examined, chest x-rays and CT chest images personally reviewed 71 year old female presented to [facility] hospital with acute respiratory distress and a large right pleural effusion. She has history of breast cancer status post right breast surgery greater than 15 years ago. She does not have chronic lung disease and is not on home oxygen. She underwent a thoracentesis of 1.2 L of fluid. She states the fluid was not bloody. She felt better but still has a residual effusion. Apparently the fluid was sent off for cytology as well as cultures. No results are in the medical record.  If the pleural fluid cytology is negative for cancer the patient would be best treated with right VATS and drainage. If the fluid has malignant cytology then I would recommend a right Pleurx catheter. For now I would support the patient medically waiting for the results on the pleural effusion.  The patient is listed as DNR however she said she would undergo surgery to help with her respiratory status. She appeared to be somewhat unclear about DNR CODE STATUS.  Objective: Vital signs in last 24 hours: Temp:  [97.6 F (36.4 C)-98.3 F (36.8 C)] 98.2 F (36.8 C) (08/15 1501) Pulse Rate:  [90-100] 100 (08/15 1501) Resp:  [16] 16 (08/15 1501) BP: (108-139)/(57-69) 108/69 (08/15 1501) SpO2:  [97 %-98 %] 98 % (08/15 1501)  Hemodynamic parameters for last 24 hours:    Intake/Output from previous day: 08/14 0701 - 08/15 0700 In: 510 [P.O.:360; IV Piggyback:150] Out: 575 [Urine:575] Intake/Output this shift: No intake/output data recorded.       Exam    General- alert and comfortable elderly and fragile-appearing   Lungs- diminished breath sounds at the right base, no tenderness to the right chest wall, no wheezes   Cor- regular rate and rhythm, no murmur , gallop   Abdomen- soft, non-tender   Extremities - warm, non-tender, minimal edema   Neuro- oriented, appropriate, no focal weakness   Lab  Results:  Recent Labs  08/11/17 1246 08/12/17 0542  WBC 11.3* 9.6  HGB 10.0* 9.7*  HCT 31.3* 29.7*  PLT 356 351   BMET:  Recent Labs  08/11/17 1246 08/12/17 0542  NA 135 134*  K 3.5 3.3*  CL 99* 98*  CO2 25 26  GLUCOSE 156* 130*  BUN 20 19  CREATININE 1.12* 1.18*  CALCIUM 8.9 8.6*    PT/INR: No results for input(s): LABPROT, INR in the last 72 hours. ABG No results found for: PHART, HCO3, TCO2, ACIDBASEDEF, O2SAT CBG (last 3)   Recent Labs  08/12/17 2144 08/13/17 0800 08/13/17 1222  GLUCAP 223* 147* 284*    Assessment/Plan: S/P  Follow-up results of pleural fluid cytology and culture and then decide on the surgical procedure that would best help the patient. Plan as stated above   LOS: 2 days    [name] 08/13/2017

## 2017-08-13 NOTE — Progress Notes (Signed)
PROGRESS NOTE    Kelly Gay  JTT:[id number] DOB: 1946-04-22 DOA: 08/11/2017 PCP: [facility]     Brief Narrative:  71 year old woman admitted from home on 8/13 with complaints of shortness of breath. She has a remote history of breast cancer, tobacco abuse. She has been short of breath for about 2 weeks but today became acutely worse. Presented to the hospital where chest x-ray showed widespread airspace consolidation consistent with multifocal pneumonia on the right with possibility of lymphangitic spread of tumor in the left base. Subsequent CT chest showed a large loculated right pleural effusion with multisegment collapse. She underwent a thoracentesis yielding 1.2 L of fluid. Fluid studies are pending. Shortness of breath has improved.   Assessment & Plan:   Principal Problem:   Acute on chronic respiratory failure with hypoxia (HCC) Active Problems:   Normocytic anemia   Diabetes mellitus type 2 in nonobese [facility])   Essential hypertension   Pleural effusion   Tobacco dependence   Acute hypoxemic respiratory failure -Due to large loculated pleural effusion. -Potential etiologies include pneumonia plus minus recurrence of breast malignancy. -Cytology from thoracentesis is pending. -Dr. Luan Pulling has discussed with thoracic surgery in [facility] and decision has been made to transfer for potential VATS. Awaiting further evaluation and recommendations by thoracic surgery. -Wean oxygen as tolerated -Is currently on broad-spectrum antibiotics including vancomycin, Zosyn, azithromycin. -MRSA PCR is negative, will discontinue vancomycin.  Tobacco abuse -Counseled on cessation   DVT prophylaxis: Lovenox Code Status: DO NOT RESUSCITATE Family Communication: Husband at bedside updated on plan of care and questions answered Disposition Plan: Transfer to [facility] for [facility] consultation.  Consultants:   Pulmonary  Cardiothoracic  surgery  Procedures:   None  Antimicrobials:  Anti-infectives    Start     Dose/Rate Route Frequency Ordered Stop   08/12/17 1800  vancomycin (VANCOCIN) IVPB 750 mg/150 ml premix     750 mg 150 mL/hr over 60 Minutes Intravenous Every 24 hours 08/11/17 2056     08/11/17 2200  piperacillin-tazobactam (ZOSYN) IVPB 3.375 g     3.375 g 12.5 mL/hr over 240 Minutes Intravenous Every 8 hours 08/11/17 2054     08/11/17 2100  vancomycin (VANCOCIN) IVPB 1000 mg/200 mL premix     1,000 mg 200 mL/hr over 60 Minutes Intravenous  Once 08/11/17 2053 08/11/17 2317   08/11/17 1500  cefTRIAXone (ROCEPHIN) 1 g in dextrose 5 % 50 mL IVPB     1 g 100 mL/hr over 30 Minutes Intravenous  Once 08/11/17 1458 08/11/17 1556   08/11/17 1500  azithromycin (ZITHROMAX) 500 mg in dextrose 5 % 250 mL IVPB     500 mg 250 mL/hr over 60 Minutes Intravenous  Once 08/11/17 1458 08/11/17 1702       Subjective: Still feels short of breath, mild increased work of breathing noted  Objective: Vitals:   08/12/17 1833 08/12/17 2127 08/13/17 0539 08/13/17 1501  BP: (!) 114/55 (!) 139/57 114/64 108/69  Pulse: 94 94 90 100  Resp: 18 16 16 16   Temp: 98.1 F (36.7 C) 97.6 F (36.4 C) 98.3 F (36.8 C) 98.2 F (36.8 C)  TempSrc: Oral Oral Oral Oral  SpO2: 98% 98% 97% 98%  Weight: 50.9 kg (112 lb 3.4 oz)     Height: 5\' 4"  (1.626 m)       Intake/Output Summary (Last 24 hours) at 08/13/17 1739 Last data filed at 08/13/17 1000  Gross per 24 hour  Intake  630 ml  Output              550 ml  Net               80 ml   Filed Weights   08/11/17 1237 08/12/17 1833  Weight: 50.8 kg (112 lb) 50.9 kg (112 lb 3.4 oz)    Examination:  General exam: Alert, awake, oriented x 3, in nad. Respiratory system: Decreased breath sounds right mid lung fields down. Cardiovascular system: RRR. No murmurs, rubs, gallops. Gastrointestinal system: Abdomen is nondistended, soft and nontender. No organomegaly or masses felt.  Normal bowel sounds heard. Central nervous system: Alert and oriented. No focal neurological deficits. Extremities: No C/C/E, +pedal pulses Skin: No rashes, lesions or ulcers Psychiatry:  Mood & affect appropriate.   Data Reviewed: I have personally reviewed following labs and imaging studies  CBC:  Recent Labs Lab 08/11/17 1246 08/12/17 0542  WBC 11.3* 9.6  NEUTROABS 8.7*  --   HGB 10.0* 9.7*  HCT 31.3* 29.7*  MCV 87.4 87.9  PLT 356 749   Basic Metabolic Panel:  Recent Labs Lab 08/11/17 1246 08/12/17 0542  NA 135 134*  K 3.5 3.3*  CL 99* 98*  CO2 25 26  GLUCOSE 156* 130*  BUN 20 19  CREATININE 1.12* 1.18*  CALCIUM 8.9 8.6*   GFR: Estimated Creatinine Clearance: 35.1 mL/min (A) (by C-G formula based on SCr of 1.18 mg/dL (H)). Liver Function Tests:  Recent Labs Lab 08/11/17 1246  AST 15  ALT 11*  ALKPHOS 46  BILITOT 0.5  PROT 7.4  ALBUMIN 3.1*   No results for input(s): LIPASE, AMYLASE in the last 168 hours. No results for input(s): AMMONIA in the last 168 hours. Coagulation Profile: No results for input(s): INR, PROTIME in the last 168 hours. Cardiac Enzymes:  Recent Labs Lab 08/11/17 1246  TROPONINI <0.03   BNP (last 3 results) No results for input(s): PROBNP in the last 8760 hours. HbA1C:  Recent Labs  08/11/17 1803  HGBA1C 6.7*   CBG:  Recent Labs Lab 08/12/17 1132 08/12/17 1558 08/12/17 2144 08/13/17 0800 08/13/17 1222  GLUCAP 203* 107* 223* 147* 284*   Lipid Profile: No results for input(s): CHOL, HDL, LDLCALC, TRIG, CHOLHDL, LDLDIRECT in the last 72 hours. Thyroid Function Tests: No results for input(s): TSH, T4TOTAL, FREET4, T3FREE, THYROIDAB in the last 72 hours. Anemia Panel: No results for input(s): VITAMINB12, FOLATE, FERRITIN, TIBC, IRON, RETICCTPCT in the last 72 hours. Urine analysis: No results found for: COLORURINE, APPEARANCEUR, [place], [place], [place], [place], [place], KETONESUR, PROTEINUR, UROBILINOGEN,  NITRITE, LEUKOCYTESUR Sepsis Labs: @LABRCNTIP (procalcitonin:4,lacticidven:4)  ) Recent Results (from the past 240 hour(s))  Culture, body fluid-bottle     Status: None (Preliminary result)   Collection Time: 08/11/17  5:00 PM  Result Value Ref Range Status   Specimen Description PLEURAL COLLECTED BY DOCTOR  Final   Special Requests BOTTLES DRAWN AEROBIC AND ANAEROBIC 10 CC EACH  Final   Culture NO GROWTH 2 DAYS  Final   Report Status PENDING  Incomplete  Gram stain     Status: None   Collection Time: 08/11/17  5:00 PM  Result Value Ref Range Status   Specimen Description PLEURAL  Final   Special Requests NONE  Final   Gram Stain   Final    Performed at [place] WBC PRESENT, PREDOMINANTLY PMN    Report Status 08/11/2017 FINAL  Final  MRSA PCR Screening     Status: None  Collection Time: 08/11/17  8:33 PM  Result Value Ref Range Status   MRSA by PCR NEGATIVE NEGATIVE Final    Comment:        The GeneXpert MRSA Assay (FDA approved for NASAL specimens only), is one component of a comprehensive MRSA colonization surveillance program. It is not intended to diagnose MRSA infection nor to guide or monitor treatment for MRSA infections.          Radiology Studies: No results found.      Scheduled Meds: . buPROPion  150 mg Oral Daily  . docusate sodium  100 mg Oral BID  . enoxaparin (LOVENOX) injection  40 mg Subcutaneous Q24H  . hydrochlorothiazide  12.5 mg Oral Daily  . insulin aspart  0-5 Units Subcutaneous QHS  . insulin aspart  0-9 Units Subcutaneous TID WC  . loratadine  10 mg Oral Daily  . losartan  25 mg Oral Daily  . multivitamin with minerals  1 tablet Oral Daily  . omega-3 acid ethyl esters  1 capsule Oral Daily   Continuous Infusions: . piperacillin-tazobactam (ZOSYN)  IV 3.375 g (08/13/17 1351)  . vancomycin Stopped (08/12/17 1838)     LOS: 2 days    Time spent: 35 minutes. Greater than 50% of this time was spent  in direct contact with the patient coordinating care.   Velvet Bathe, MD [facility] Pager [phone]  If 7PM-7AM, please contact night-coverage [email or web address] Password [place] 08/13/2017, 5:39 PM

## 2017-08-14 ENCOUNTER — Inpatient Hospital Stay (HOSPITAL_COMMUNITY): Payer: Medicare Other

## 2017-08-14 ENCOUNTER — Other Ambulatory Visit: Payer: Self-pay | Admitting: Oncology

## 2017-08-14 ENCOUNTER — Encounter (HOSPITAL_COMMUNITY): Payer: Self-pay | Admitting: Radiology

## 2017-08-14 DIAGNOSIS — Z87891 Personal history of nicotine dependence: Secondary | ICD-10-CM

## 2017-08-14 DIAGNOSIS — J90 Pleural effusion, not elsewhere classified: Principal | ICD-10-CM

## 2017-08-14 DIAGNOSIS — Z853 Personal history of malignant neoplasm of breast: Secondary | ICD-10-CM

## 2017-08-14 LAB — BASIC METABOLIC PANEL
Anion gap: 10 (ref 5–15)
BUN: 17 mg/dL (ref 6–20)
CALCIUM: 9 mg/dL (ref 8.9–10.3)
CO2: 29 mmol/L (ref 22–32)
CREATININE: 1.46 mg/dL — AB (ref 0.44–1.00)
Chloride: 99 mmol/L — ABNORMAL LOW (ref 101–111)
GFR, EST AFRICAN AMERICAN: 41 mL/min — AB (ref >60)
GFR, EST NON AFRICAN AMERICAN: 35 mL/min — AB (ref >60)
Glucose, Bld: 219 mg/dL — ABNORMAL HIGH (ref 65–99)
Potassium: 3.6 mmol/L (ref 3.5–5.1)
Sodium: 138 mmol/L (ref 135–145)

## 2017-08-14 LAB — GLUCOSE, CAPILLARY
GLUCOSE-CAPILLARY: 132 mg/dL — AB (ref 65–99)
Glucose-Capillary: 146 mg/dL — ABNORMAL HIGH (ref 65–99)
Glucose-Capillary: 184 mg/dL — ABNORMAL HIGH (ref 65–99)
Glucose-Capillary: 193 mg/dL — ABNORMAL HIGH (ref 65–99)
Glucose-Capillary: 156 mg/dL — ABNORMAL HIGH (ref 65–99)

## 2017-08-14 MED ORDER — ENOXAPARIN SODIUM 30 MG/0.3ML ~~LOC~~ SOLN
30.0000 mg | SUBCUTANEOUS | Status: DC
  Administered 2017-08-15 – 2017-08-20 (×5): 30 mg via SUBCUTANEOUS
  Filled 2017-08-14 (×5): qty 0.3

## 2017-08-14 NOTE — Progress Notes (Signed)
Procedure(s) (LRB): INSERTION PLEURAL DRAINAGE CATHETER (Right) Subjective: Pleural fluid cytology with atypical cells that stain for TTF-1 : prob malig effusion, lung cancer Will plan R pleurx catheter placement in OR Monday  8-20 If she becomes symptomatic with SOB before then do US guided thoracentesis  Objective: Vital signs in last 24 hours: Temp:  [97.9 F (36.6 C)-98.2 F (36.8 C)] 97.9 F (36.6 C) (08/16 1420) Pulse Rate:  [87-100] 92 (08/16 1420) Resp:  [16-18] 18 (08/16 1420) BP: (108-119)/(61-69) 115/61 (08/16 1420) SpO2:  [97 %-99 %] 99 % (08/16 1420)  Hemodynamic parameters for last 24 hours:  stable  Intake/Output from previous day: 08/15 0701 - 08/16 0700 In: 220 [P.O.:120; IV Piggyback:100] Out: 100 [Urine:100] Intake/Output this shift: Total I/O In: 360 [P.O.:360] Out: -     Lab Results:  Recent Labs  08/12/17 0542  WBC 9.6  HGB 9.7*  HCT 29.7*  PLT 351   BMET:  Recent Labs  08/12/17 0542 08/14/17 1053  NA 134* 138  K 3.3* 3.6  CL 98* 99*  CO2 26 29  GLUCOSE 130* 219*  BUN 19 17  CREATININE 1.18* 1.46*  CALCIUM 8.6* 9.0    PT/INR: No results for input(s): LABPROT, INR in the last 72 hours. ABG No results found for: PHART, HCO3, TCO2, ACIDBASEDEF, O2SAT CBG (last 3)   Recent Labs  08/13/17 2158 08/14/17 0737 08/14/17 1219  GLUCAP 146* 146* 184*    Assessment/Plan: S/P Procedure(s) (LRB): INSERTION PLEURAL DRAINAGE CATHETER (Right) Moderate R plueral effusion, prob malignant Pleurx cath Monday   LOS: 3 days    [name] 08/14/2017

## 2017-08-14 NOTE — Progress Notes (Signed)
We should have the results of today's pleural biopsy within 48 hours. If patient is discharged I will follow up with her as discussed previously (consider PET and referral to [place])

## 2017-08-14 NOTE — Progress Notes (Signed)
PROGRESS NOTE    Kelly Gay  ZJI:[id number] DOB: 1946-01-26 DOA: 08/11/2017 PCP: [facility]     Brief Narrative:  71 year old woman admitted from home on 8/13 with complaints of shortness of breath. She has a remote history of breast cancer, tobacco abuse. She has been short of breath for about 2 weeks but today became acutely worse. Presented to the hospital where chest x-ray showed widespread airspace consolidation consistent with multifocal pneumonia on the right with possibility of lymphangitic spread of tumor in the left base. Subsequent CT chest showed a large loculated right pleural effusion with multisegment collapse. She underwent a thoracentesis yielding 1.2 L of fluid. Fluid studies are pending. Shortness of breath has improved.   Assessment & Plan:   Principal Problem:   Acute on chronic respiratory failure with hypoxia (HCC) Active Problems:   Normocytic anemia   Diabetes mellitus type 2 in nonobese [facility])   Essential hypertension   Pleural effusion   Tobacco dependence   Acute hypoxemic respiratory failure -Due to large loculated pleural effusion. -Potential etiologies include pneumonia plus minus recurrence of breast malignancy. -Cytology from thoracentesis has been obtained and reporting atypical cells that stain for TTF-1: probably malig effusion, consulted oncology and orders placed for CEA, CA 27-29, CA 19. CT of chest ordered - Cardiothoracic surgeon consulted and on board. Plan is for Pleurx catheter placement Monday. Should patient develop any worsening shortness of breath and we are to perform ultrasound guided thoracentesis  Tobacco abuse -Counseled on cessation   DVT prophylaxis: Lovenox Code Status: DO NOT RESUSCITATE Family Communication: Husband at bedside updated on plan of care and questions answered Disposition Plan: [facility] for [facility] consultation.  Consultants:    Pulmonary  Cardiothoracic surgery  Procedures:   None  Antimicrobials:  Anti-infectives    Start     Dose/Rate Route Frequency Ordered Stop   08/12/17 1800  vancomycin (VANCOCIN) IVPB 750 mg/150 ml premix     750 mg 150 mL/hr over 60 Minutes Intravenous Every 24 hours 08/11/17 2056     08/11/17 2200  piperacillin-tazobactam (ZOSYN) IVPB 3.375 g     3.375 g 12.5 mL/hr over 240 Minutes Intravenous Every 8 hours 08/11/17 2054     08/11/17 2100  vancomycin (VANCOCIN) IVPB 1000 mg/200 mL premix     1,000 mg 200 mL/hr over 60 Minutes Intravenous  Once 08/11/17 2053 08/11/17 2317   08/11/17 1500  cefTRIAXone (ROCEPHIN) 1 g in dextrose 5 % 50 mL IVPB     1 g 100 mL/hr over 30 Minutes Intravenous  Once 08/11/17 1458 08/11/17 1556   08/11/17 1500  azithromycin (ZITHROMAX) 500 mg in dextrose 5 % 250 mL IVPB     500 mg 250 mL/hr over 60 Minutes Intravenous  Once 08/11/17 1458 08/11/17 1702       Subjective: Patient reports no new problems.  Objective: Vitals:   08/13/17 1501 08/13/17 2052 08/14/17 0453 08/14/17 1420  BP: 108/69 111/62 119/61 115/61  Pulse: 100 99 87 92  Resp: 16 18 18 18   Temp: 98.2 F (36.8 C) 98.1 F (36.7 C) 98.1 F (36.7 C) 97.9 F (36.6 C)  TempSrc: Oral Oral Oral Oral  SpO2: 98% 97% 99% 99%  Weight:      Height:        Intake/Output Summary (Last 24 hours) at 08/14/17 1543 Last data filed at 08/14/17 1300  Gross per 24 hour  Intake  460 ml  Output                0 ml  Net              460 ml   Filed Weights   08/11/17 1237 08/12/17 1833  Weight: 50.8 kg (112 lb) 50.9 kg (112 lb 3.4 oz)    Examination:  General exam:Patient is alert and awake. No acute distress Respiratory system: Decreased breath sounds right mid lung fields down, no wheezes. Cardiovascular system: RRR. No murmurs, rubs, gallops. Gastrointestinal system: Abdomen is nondistended, soft and nontender. No organomegaly or masses felt. Normal bowel sounds  heard. Central nervous system: Alert and oriented. No focal neurological deficits. Extremities: No C/C/E, +pedal pulses Skin: No rashes, lesions or ulcers Psychiatry:  Mood & affect appropriate.   Data Reviewed: I have personally reviewed following labs and imaging studies  CBC:  Recent Labs Lab 08/11/17 1246 08/12/17 0542  WBC 11.3* 9.6  NEUTROABS 8.7*  --   HGB 10.0* 9.7*  HCT 31.3* 29.7*  MCV 87.4 87.9  PLT 356 408   Basic Metabolic Panel:  Recent Labs Lab 08/11/17 1246 08/12/17 0542 08/14/17 1053  NA 135 134* 138  K 3.5 3.3* 3.6  CL 99* 98* 99*  CO2 25 26 29   GLUCOSE 156* 130* 219*  BUN 20 19 17   CREATININE 1.12* 1.18* 1.46*  CALCIUM 8.9 8.6* 9.0   GFR: Estimated Creatinine Clearance: 28.4 mL/min (A) (by C-G formula based on SCr of 1.46 mg/dL (H)). Liver Function Tests:  Recent Labs Lab 08/11/17 1246  AST 15  ALT 11*  ALKPHOS 46  BILITOT 0.5  PROT 7.4  ALBUMIN 3.1*   No results for input(s): LIPASE, AMYLASE in the last 168 hours. No results for input(s): AMMONIA in the last 168 hours. Coagulation Profile: No results for input(s): INR, PROTIME in the last 168 hours. Cardiac Enzymes:  Recent Labs Lab 08/11/17 1246  TROPONINI <0.03   BNP (last 3 results) No results for input(s): PROBNP in the last 8760 hours. HbA1C:  Recent Labs  08/11/17 1803  HGBA1C 6.7*   CBG:  Recent Labs Lab 08/13/17 1222 08/13/17 1710 08/13/17 2158 08/14/17 0737 08/14/17 1219  GLUCAP 284* 132* 146* 146* 184*   Lipid Profile: No results for input(s): CHOL, HDL, LDLCALC, TRIG, CHOLHDL, LDLDIRECT in the last 72 hours. Thyroid Function Tests: No results for input(s): TSH, T4TOTAL, FREET4, T3FREE, THYROIDAB in the last 72 hours. Anemia Panel: No results for input(s): VITAMINB12, FOLATE, FERRITIN, TIBC, IRON, RETICCTPCT in the last 72 hours. Urine analysis: No results found for: COLORURINE, APPEARANCEUR, [place], [place], GLUCOSEU, [place], BILIRUBINUR,  KETONESUR, PROTEINUR, UROBILINOGEN, NITRITE, LEUKOCYTESUR Sepsis Labs: @LABRCNTIP (procalcitonin:4,lacticidven:4)  ) Recent Results (from the past 240 hour(s))  Culture, body fluid-bottle     Status: None (Preliminary result)   Collection Time: 08/11/17  5:00 PM  Result Value Ref Range Status   Specimen Description PLEURAL COLLECTED BY DOCTOR  Final   Special Requests BOTTLES DRAWN AEROBIC AND ANAEROBIC 10 CC EACH  Final   Culture NO GROWTH 3 DAYS  Final   Report Status PENDING  Incomplete  Gram stain     Status: None   Collection Time: 08/11/17  5:00 PM  Result Value Ref Range Status   Specimen Description PLEURAL  Final   Special Requests NONE  Final   Gram Stain   Final    Performed at [facility] WBC PRESENT, PREDOMINANTLY PMN    Report Status 08/11/2017  FINAL  Final  MRSA PCR Screening     Status: None   Collection Time: 08/11/17  8:33 PM  Result Value Ref Range Status   MRSA by PCR NEGATIVE NEGATIVE Final    Comment:        The GeneXpert MRSA Assay (FDA approved for NASAL specimens only), is one component of a comprehensive MRSA colonization surveillance program. It is not intended to diagnose MRSA infection nor to guide or monitor treatment for MRSA infections.          Radiology Studies: No results found.      Scheduled Meds: . buPROPion  150 mg Oral Daily  . docusate sodium  100 mg Oral BID  . [START ON 08/15/2017] enoxaparin (LOVENOX) injection  30 mg Subcutaneous Q24H  . hydrochlorothiazide  12.5 mg Oral Daily  . insulin aspart  0-5 Units Subcutaneous QHS  . insulin aspart  0-9 Units Subcutaneous TID WC  . loratadine  10 mg Oral Daily  . losartan  25 mg Oral Daily  . multivitamin with minerals  1 tablet Oral Daily  . omega-3 acid ethyl esters  1 capsule Oral Daily   Continuous Infusions: . piperacillin-tazobactam (ZOSYN)  IV 3.375 g (08/14/17 1409)  . vancomycin Stopped (08/13/17 1859)     LOS: 3 days    Time  spent: 35 minutes. Greater than 50% of this time was spent in direct contact with the patient coordinating care.   Velvet Bathe, MD [facility] Pager [phone]  If 7PM-7AM, please contact night-coverage [email or web address] Password [facility] 08/14/2017, 3:43 PM

## 2017-08-14 NOTE — Consult Note (Signed)
[place]  [phone] [phone]     ID: Kelly Gay DOB: 23-Jan-1946  MR#: [id number]  BLT#:[id number]  Patient Care Team: [facility] as PCP - General Rourk, Cristopher Estimable, MD as Consulting Physician (Gastroenterology) Chauncey Cruel, MD OTHER MD:  CHIEF COMPLAINT:   CURRENT TREATMENT:    HISTORY OF CURRENT ILLNESS: Kelly Gay became more SOB than usual over the past 10 days but did not have fever, purulent sputum or significant cough. She presented to Urgent Care and CXR showed a large Right effusion. She was admitted to [facility] and underwent Right thoracentesis 08/11/2017. The cytology ([id number]) showed atypical cells that were TTF-1 positive.  I discussed the report with Dr Orene Desanctis, the pathologist. She did additional immunostains, including estrogen receptor (negative) but did not report them because the cells are not diagnostic of cancer. Recall that alveolar cells are also TTF-1 positive.   The patient had a CT-angio 08/11/2017 which showed the effusion to be at least partly loculated and suggested some pleural thickening but found no obvious mass, no findings in the Left lung, liver or bones, and no significant adenopathy.  She had a negative colonoscopy 08/08/2017 and negative mammography SEPT 2017  The patient's subsequent history is as detailed below.  INTERVAL HISTORY: I met with the patient in her hospital room 08/14/2017. No family was present.   REVIEW OF SYSTEMS: She tells me her breathing is much better. She denies coulgh, pleurisy, hemoptysis or fever. Denies unusual or persistent h/a, N/V, dizzyness of gait imbalance. Denies focal or persistent pain.  PAST MEDICAL HISTORY: Past Medical History:  Diagnosis Date  . Breast cancer ([place]) 1998   Right nipple inversion s/p lumpectomy/resection, chemo/rads, no recurrence  . Bronchitis   . Diabetes ([place])   . HTN (hypertension)   . Hyperlipidemia      PAST SURGICAL HISTORY: Past Surgical History:  Procedure Laterality Date  . ABDOMINAL HYSTERECTOMY    . BREAST SURGERY     partial, right for cancer  . COLON BIOPSY  2005    Dr. Gala Romney: normal  . COLONOSCOPY  2004   Dr. Gala Romney: multiple rectosigmoid polyps, sigmoid polyp with carcinoma in situ  . COLONOSCOPY N/A 07/18/2014   Procedure: COLONOSCOPY;  Surgeon: Daneil Dolin, MD;  [facility];  Service: Endoscopy;  Laterality: N/A;  10:00  . COLONOSCOPY N/A 08/08/2017   Procedure: COLONOSCOPY;  Surgeon: Daneil Dolin, MD;  [facility];  Service: Endoscopy;  Laterality: N/A;  9:30 AM  . ESOPHAGOGASTRODUODENOSCOPY N/A 07/18/2014   Procedure: ESOPHAGOGASTRODUODENOSCOPY (EGD);  Surgeon: Daneil Dolin, MD;  Location[facility];  Service: Endoscopy;  Laterality: N/A;    FAMILY HISTORY Family History  Problem Relation Age of Onset  . Throat cancer Brother   . Lung cancer Brother   . Cancer Brother        ?prostate  . COPD Mother   . Heart attack Father   . Colon cancer Neg Hx   Father died age 35 with CHF; mother died of COPD complications. The patient has 3 brothers, 1 sister. One brother has a Hx of head and neck CA.  GYNECOLOGIC HISTORY:  No LMP recorded. Patient has had a hysterectomy. Menarche age 70, first live birth age 18, she is GXP2; underwent hysterectomy w/o salpingo-oophorectomy >40 years ago; did not take HR  SOCIAL HISTORY:  Retired Medical illustrator; husband Johnny retired from Printmaker at [employer]. Son Robyn Haber works in  maintenance in [place]; son Oda Kilts is a [place] [place] [employer] planning/management officer and lives in [place]. The patient has 2 grand-daughters and one great-grand-daughter. 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 Smoking status: Former Smoker    Packs/day: 1.00    Years: 50.00    Types: Cigarettes  . Smokeless tobacco: Never Used     Comment: quit in July 2018  . Alcohol  use No      Allergies  Allergen Reactions  . Incruse Ellipta [Umeclidinium Bromide]     Anxiety     Current Facility-Administered Medications  Medication Dose Route Frequency Provider Last Rate Last Dose  . acetaminophen (TYLENOL) tablet 650 mg  650 mg Oral Q6H PRN Karmen Bongo, MD       Or  . acetaminophen (TYLENOL) suppository 650 mg  650 mg Rectal Q6H PRN Karmen Bongo, MD      . buPROPion (WELLBUTRIN XL) 24 hr tablet 150 mg  150 mg Oral Daily Karmen Bongo, MD   150 mg at 08/14/17 0942  . docusate sodium (COLACE) capsule 100 mg  100 mg Oral BID Karmen Bongo, MD   100 mg at 08/14/17 0943  . [START ON 08/15/2017] enoxaparin (LOVENOX) injection 30 mg  30 mg Subcutaneous Q24H Velvet Bathe, MD      . hydrochlorothiazide (MICROZIDE) capsule 12.5 mg  12.5 mg Oral Daily Karmen Bongo, MD   12.5 mg at 08/14/17 0942  . insulin aspart (novoLOG) injection 0-5 Units  0-5 Units Subcutaneous QHS Karmen Bongo, MD   2 Units at 08/12/17 2216  . insulin aspart (novoLOG) injection 0-9 Units  0-9 Units Subcutaneous TID WC Karmen Bongo, MD   2 Units at 08/14/17 1757  . loratadine (CLARITIN) tablet 10 mg  10 mg Oral Daily Karmen Bongo, MD      . losartan (COZAAR) tablet 25 mg  25 mg Oral Daily Karmen Bongo, MD   25 mg at 08/14/17 0942  . morphine 4 MG/ML injection 2 mg  2 mg Intravenous Q2H PRN Isaac Bliss, Rayford Halsted, MD      . multivitamin with minerals tablet 1 tablet  1 tablet Oral Daily Karmen Bongo, MD   1 tablet at 08/14/17 [phone]  . omega-3 acid ethyl esters (LOVAZA) capsule 1 g  1 capsule Oral Daily Karmen Bongo, MD   1 g at 08/14/17 [phone]  . ondansetron (ZOFRAN) tablet 4 mg  4 mg Oral Q6H PRN Karmen Bongo, MD       Or  . ondansetron [facility]) injection 4 mg  4 mg Intravenous Q6H PRN Karmen Bongo, MD      . piperacillin-tazobactam (ZOSYN) IVPB 3.375 g  3.375 g Intravenous Lynne Logan, MD   Stopped at 08/14/17 1757  . vancomycin (VANCOCIN) IVPB 750 mg/150  ml premix  750 mg Intravenous Q24H Karmen Bongo, MD 150 mL/hr at 08/14/17 1757 750 mg at 08/14/17 1757    OBJECTIVE:  Vitals:   08/14/17 0453 08/14/17 1420  BP: 119/61 115/61  Pulse: 87 92  Resp: 18 18  Temp: 98.1 F (36.7 C) 97.9 F (36.6 C)  SpO2: 99% 99%     Body mass index is 19.26 kg/m.   Wt Readings from Last 3 Encounters:  08/12/17 112 lb 3.4 oz (50.9 kg)  08/08/17 112 lb (50.8 kg)  06/22/14 121 lb (54.9 kg)      ECOG FS:2 - Symptomatic, <50% confined to bed  Ocular: Sclerae unicteric, pupils  round and equal Lymphatic: No cervical or supraclavicular adenopathy Lungs no rales or rhonchi Heart regular rate and rhythm Abd soft, nontender, positive bowel sounds MSK no focal spinal tenderness, no joint edema Neuro: non-focal, well-oriented, appropriate affect Breasts: Right breast is s/p lumpectomy and radiation; no evidence of local recurrence; left breast is unremarkable; both axillae are benign   LAB RESULTS:  CMP     Component Value Date/Time   NA 138 08/14/2017 1053   K 3.6 08/14/2017 1053   CL 99 (L) 08/14/2017 1053   CO2 29 08/14/2017 1053   GLUCOSE 219 (H) 08/14/2017 1053   BUN 17 08/14/2017 1053   CREATININE 1.46 (H) 08/14/2017 1053   CALCIUM 9.0 08/14/2017 1053   PROT 7.4 08/11/2017 1246   ALBUMIN 3.1 (L) 08/11/2017 1246   AST 15 08/11/2017 1246   ALT 11 (L) 08/11/2017 1246   ALKPHOS 46 08/11/2017 1246   BILITOT 0.5 08/11/2017 1246   GFRNONAA 35 (L) 08/14/2017 1053   GFRAA 41 (L) 08/14/2017 1053    No results found for: TOTALPROTELP, ALBUMINELP, A1GS, A2GS, BETS, BETA2SER, GAMS, MSPIKE, SPEI  No results found for: KPAFRELGTCHN, LAMBDASER, KAPLAMBRATIO  Lab Results  Component Value Date   WBC 9.6 08/12/2017   NEUTROABS 8.7 (H) 08/11/2017   HGB 9.7 (L) 08/12/2017   HCT 29.7 (L) 08/12/2017   MCV 87.9 08/12/2017   PLT 351 08/12/2017    '@LASTCHEMISTRY' @  No results found for: LABCA2  No components found for: VKPQAE497  No results  for input(s): INR in the last 168 hours.  No results found for: LABCA2  No results found for: NPY051  No results found for: TMY111  No results found for: NBV670  No results found for: CA2729  No components found for: HGQUANT  No results found for: CEA1 / No results found for: CEA1   No results found for: AFPTUMOR  No results found for: CHROMOGRNA  No results found for: PSA1  Admission on 08/11/2017  Component Date Value Ref Range Status  . WBC 08/11/2017 11.3* 4.0 - 10.5 K/uL Final  . RBC 08/11/2017 3.58* 3.87 - 5.11 MIL/uL Final  . Hemoglobin 08/11/2017 10.0* 12.0 - 15.0 g/dL Final  . HCT 08/11/2017 31.3* 36.0 - 46.0 % Final  . MCV 08/11/2017 87.4  78.0 - 100.0 fL Final  . MCH 08/11/2017 27.9  26.0 - 34.0 pg Final  . MCHC 08/11/2017 31.9  30.0 - 36.0 g/dL Final  . RDW 08/11/2017 14.9  11.5 - 15.5 % Final  . Platelets 08/11/2017 356  150 - 400 K/uL Final  . Neutrophils Relative % 08/11/2017 76  % Final  . Neutro Abs 08/11/2017 8.7* 1.7 - 7.7 K/uL Final  . Lymphocytes Relative 08/11/2017 13  % Final  . Lymphs Abs 08/11/2017 1.4  0.7 - 4.0 K/uL Final  . Monocytes Relative 08/11/2017 10  % Final  . Monocytes Absolute 08/11/2017 1.1* 0.1 - 1.0 K/uL Final  . Eosinophils Relative 08/11/2017 1  % Final  . Eosinophils Absolute 08/11/2017 0.1  0.0 - 0.7 K/uL Final  . Basophils Relative 08/11/2017 0  % Final  . Basophils Absolute 08/11/2017 0.0  0.0 - 0.1 K/uL Final  . Sodium 08/11/2017 135  135 - 145 mmol/L Final  . Potassium 08/11/2017 3.5  3.5 - 5.1 mmol/L Final  . Chloride 08/11/2017 99* 101 - 111 mmol/L Final  . CO2 08/11/2017 25  22 - 32 mmol/L Final  . Glucose, Bld 08/11/2017 156* 65 - 99 mg/dL Final  .  BUN 08/11/2017 20  6 - 20 mg/dL Final  . Creatinine, Ser 08/11/2017 1.12* 0.44 - 1.00 mg/dL Final  . Calcium 08/11/2017 8.9  8.9 - 10.3 mg/dL Final  . Total Protein 08/11/2017 7.4  6.5 - 8.1 g/dL Final  . Albumin 08/11/2017 3.1* 3.5 - 5.0 g/dL Final  . AST 08/11/2017  15  15 - 41 U/L Final  . ALT 08/11/2017 11* 14 - 54 U/L Final  . Alkaline Phosphatase 08/11/2017 46  38 - 126 U/L Final  . Total Bilirubin 08/11/2017 0.5  0.3 - 1.2 mg/dL Final  . GFR calc non Af Amer 08/11/2017 48* >60 mL/min Final  . GFR calc Af Amer 08/11/2017 56* >60 mL/min Final   Comment: (NOTE) The eGFR has been calculated using the CKD EPI equation. This calculation has not been validated in all clinical situations. eGFR's persistently <60 mL/min signify possible Chronic Kidney Disease.   . Anion gap 08/11/2017 11  5 - 15 Final  . Troponin I 08/11/2017 <0.03  <0.03 ng/mL Final  . B Natriuretic Peptide 08/11/2017 83.0  0.0 - 100.0 pg/mL Final  . D-Dimer, Quant 08/11/2017 2.63* 0.00 - 0.50 ug/mL-FEU Final   Comment: (NOTE) At the manufacturer cut-off of 0.50 ug/mL FEU, this assay has been documented to exclude PE with a sensitivity and negative predictive value of 97 to 99%.  At this time, this assay has not been approved by the FDA to exclude DVT/VTE. Results should be correlated with clinical presentation.   . Albumin, Fluid 08/11/2017 2.7  g/dL Final   Comment: (NOTE) No normal range established for this test Results should be evaluated in conjunction with serum values   . Fluid Type-FALB 08/11/2017 PLEURAL   Final  . Fluid Type-FCT 08/11/2017 PLEURAL   Final  . Color, Fluid 08/11/2017 YELLOW* YELLOW Final  . Appearance, Fluid 08/11/2017 CLOUDY* CLEAR Final  . WBC, Fluid 08/11/2017 352  0 - 1,000 cu mm Final  . Neutrophil Count, Fluid 08/11/2017 7  0 - 25 % Final  . Lymphs, Fluid 08/11/2017 79  % Final  . Monocyte-Macrophage-Serous Fluid 08/11/2017 14* 50 - 90 % Final  . Eos, Fluid 08/11/2017 0  % Final  . Glucose, Fluid 08/11/2017 124  mg/dL Final   Comment: (NOTE) No normal range established for this test Results should be evaluated in conjunction with serum values   . Fluid Type-FGLU 08/11/2017 PLEURAL   Final  . LD, Fluid 08/11/2017 162* 3 - 23 U/L Final    Comment: (NOTE) Results should be evaluated in conjunction with serum values   . Fluid Type-FLDH 08/11/2017 PLEURAL   Final  . Triglycerides, Fluid 08/11/2017 29  mg/dL Final   Comment: (NOTE) ________________________________________________________ :  Peritoneal  :       Pleural          :   Synovial     : :______________:________________________:________________: :              : Transudate :  Exudate  :                : :______________:____________:___________:________________: :  Not Estab.  : Not Estab. : Not Estab.:   Not Estab.   : :______________:____________:___________:________________: The method performance specifications have not been established for this test in body fluid. The test result should be integrated into the clinical context for interpretation. The reference intervals and other method performance specifications have not been established for this test. The test result should be integrated into the  clinical context for interpretation. Performed At: [facility] [place], [place] [id number] Lindon Romp MD NT:[id number]   . Fluid Type-FTRIG 08/11/2017 PLEURAL   Final  . Total protein, fluid 08/11/2017 4.9  g/dL Final   Comment: (NOTE) No normal range established for this test Results should be evaluated in conjunction with serum values   . Fluid Type-FTP 08/11/2017 PLEURAL   Final  . LDH 08/11/2017 139  98 - 192 U/L Final  . pH, Body Fluid 08/11/2017 7.9  Not Estab. Final   Comment: (NOTE) This test was developed and its performance characteristics determined by [facility]. It has not been cleared or approved by the Food and Drug Administration. Performed At: [facility] [place], [place] [id number] Lindon Romp MD TO:[id number]   . Source of Sample 08/11/2017 PLEURAL   Final  . Specimen Description 08/11/2017 PLEURAL COLLECTED BY DOCTOR   Final  . Special Requests 08/11/2017 BOTTLES DRAWN AEROBIC AND  ANAEROBIC 10 CC EACH   Final  . Culture 08/11/2017 NO GROWTH 3 DAYS   Final  . Report Status 08/11/2017 PENDING   Incomplete  . Specimen Description 08/11/2017 PLEURAL   Final  . Special Requests 08/11/2017 NONE   Final  . Gram Stain 08/11/2017    Final                   Value:Performed at [facility] WBC PRESENT, PREDOMINANTLY PMN   . Report Status 08/11/2017 08/11/2017 FINAL   Final  . Hgb A1c MFr Bld 08/11/2017 6.7* 4.8 - 5.6 % Final   Comment: (NOTE)         Prediabetes: 5.7 - 6.4         Diabetes: >6.4         Glycemic control for adults with diabetes: <7.0   . Mean Plasma Glucose 08/11/2017 146  mg/dL Final   Comment: (NOTE) Performed At: [facility] [place], [place] [id number] Lindon Romp MD [id number]   . Sodium 08/12/2017 134* 135 - 145 mmol/L Final  . Potassium 08/12/2017 3.3* 3.5 - 5.1 mmol/L Final  . Chloride 08/12/2017 98* 101 - 111 mmol/L Final  . CO2 08/12/2017 26  22 - 32 mmol/L Final  . Glucose, Bld 08/12/2017 130* 65 - 99 mg/dL Final  . BUN 08/12/2017 19  6 - 20 mg/dL Final  . Creatinine, Ser 08/12/2017 1.18* 0.44 - 1.00 mg/dL Final  . Calcium 08/12/2017 8.6* 8.9 - 10.3 mg/dL Final  . GFR calc non Af Amer 08/12/2017 45* >60 mL/min Final  . GFR calc Af Amer 08/12/2017 52* >60 mL/min Final   Comment: (NOTE) The eGFR has been calculated using the CKD EPI equation. This calculation has not been validated in all clinical situations. eGFR's persistently <60 mL/min signify possible Chronic Kidney Disease.   . Anion gap 08/12/2017 10  5 - 15 Final  . WBC 08/12/2017 9.6  4.0 - 10.5 K/uL Final  . RBC 08/12/2017 3.38* 3.87 - 5.11 MIL/uL Final  . Hemoglobin 08/12/2017 9.7* 12.0 - 15.0 g/dL Final  . HCT 08/12/2017 29.7* 36.0 - 46.0 % Final  . MCV 08/12/2017 87.9  78.0 - 100.0 fL Final  . MCH 08/12/2017 28.7  26.0 - 34.0 pg Final  . MCHC 08/12/2017 32.7  30.0 - 36.0 g/dL Final  . RDW 08/12/2017 15.2  11.5 -  15.5 % Final  . Platelets 08/12/2017 351  150 - 400 K/uL Final  .  MRSA by PCR 08/11/2017 NEGATIVE  NEGATIVE Final   Comment:        The GeneXpert MRSA Assay (FDA approved for NASAL specimens only), is one component of a comprehensive MRSA colonization surveillance program. It is not intended to diagnose MRSA infection nor to guide or monitor treatment for MRSA infections.   . Glucose-Capillary 08/11/2017 157* 65 - 99 mg/dL Final  . Glucose-Capillary 08/12/2017 135* 65 - 99 mg/dL Final  . Comment 1 08/12/2017 Notify RN   Final  . Comment 2 08/12/2017 Document in Chart   Final  . Glucose-Capillary 08/12/2017 203* 65 - 99 mg/dL Final  . Comment 1 08/12/2017 Notify RN   Final  . Comment 2 08/12/2017 Document in Chart   Final  . Glucose-Capillary 08/12/2017 107* 65 - 99 mg/dL Final  . Glucose-Capillary 08/12/2017 223* 65 - 99 mg/dL Final  . Glucose-Capillary 08/13/2017 147* 65 - 99 mg/dL Final  . Glucose-Capillary 08/13/2017 284* 65 - 99 mg/dL Final  . Glucose-Capillary 08/13/2017 146* 65 - 99 mg/dL Final  . Glucose-Capillary 08/14/2017 146* 65 - 99 mg/dL Final  . Comment 1 08/14/2017 Notify RN   Final  . Glucose-Capillary 08/13/2017 132* 65 - 99 mg/dL Final  . Comment 1 08/13/2017 Document in Chart   Final  . Sodium 08/14/2017 138  135 - 145 mmol/L Final  . Potassium 08/14/2017 3.6  3.5 - 5.1 mmol/L Final  . Chloride 08/14/2017 99* 101 - 111 mmol/L Final  . CO2 08/14/2017 29  22 - 32 mmol/L Final  . Glucose, Bld 08/14/2017 219* 65 - 99 mg/dL Final  . BUN 08/14/2017 17  6 - 20 mg/dL Final  . Creatinine, Ser 08/14/2017 1.46* 0.44 - 1.00 mg/dL Final  . Calcium 08/14/2017 9.0  8.9 - 10.3 mg/dL Final  . GFR calc non Af Amer 08/14/2017 35* >60 mL/min Final  . GFR calc Af Amer 08/14/2017 41* >60 mL/min Final   Comment: (NOTE) The eGFR has been calculated using the CKD EPI equation. This calculation has not been validated in all clinical situations. eGFR's persistently <60 mL/min  signify possible Chronic Kidney Disease.   . Anion gap 08/14/2017 10  5 - 15 Final  . Glucose-Capillary 08/14/2017 184* 65 - 99 mg/dL Final  . Comment 1 08/14/2017 Notify RN   Final  . Glucose-Capillary 08/14/2017 193* 65 - 99 mg/dL Final  . Comment 1 08/14/2017 Notify RN   Final    (this displays the last labs from the last 3 days)  No results found for: TOTALPROTELP, ALBUMINELP, A1GS, A2GS, BETS, BETA2SER, GAMS, MSPIKE, SPEI (this displays SPEP labs)  No results found for: KPAFRELGTCHN, LAMBDASER, KAPLAMBRATIO (kappa/lambda light chains)  No results found for: HGBA, HGBA2QUANT, HGBFQUANT, HGBSQUAN (Hemoglobinopathy evaluation)   Lab Results  Component Value Date   LDH 139 08/11/2017    No results found for: IRON, TIBC, IRONPCTSAT (Iron and TIBC)  No results found for: FERRITIN  Urinalysis No results found for: COLORURINE, APPEARANCEUR, LABSPEC, PHURINE, GLUCOSEU, HGBUR, BILIRUBINUR, KETONESUR, PROTEINUR, UROBILINOGEN, NITRITE, LEUKOCYTESUR   STUDIES: Dg Chest 1 View  Result Date: 08/11/2017 CLINICAL DATA:  Post RIGHT thoracentesis EXAM: CHEST 1 VIEW COMPARISON:  Earlier exam of 08/11/2017 FINDINGS: Normal heart size mediastinal contours. Atherosclerotic calcification aorta. Slight decrease in RIGHT pleural effusion and basilar atelectasis post thoracentesis removing 1.2 L of fluid. No pneumothorax. LEFT lung appears emphysematous but clear. Bones demineralized. IMPRESSION: Decrease in RIGHT pleural effusion and basilar atelectasis post thoracentesis without evidence of pneumothorax. Electronically Signed   By:  Lavonia Dana M.D.   On: 08/11/2017 17:14   Dg Chest 2 View  Result Date: 08/11/2017 CLINICAL DATA:  Shortness of breath and chest pain. History of breast carcinoma EXAM: CHEST  2 VIEW COMPARISON:  Chest CT October 02, 2009 FINDINGS: There is extensive airspace consolidation throughout the right middle and lower lobes. There is also consolidation in the inferior aspect  of the anterior segment right upper lobe. There may be a degree of intermixed pleural fluid. The left lung is clear except for trace left base interstitial edema. Heart size and pulmonary vascularity are normal. No evident adenopathy. No bone lesions. IMPRESSION: Widespread airspace consolidation consistent with multifocal pneumonia on the right. There may be a degree of pleural effusion intermixed with the consolidation. Trace interstitial edema left base. The possibility of mild lymphangitic spread of tumor in the left base must be of concern given history of breast carcinoma. Cardiac silhouette within normal limits. Electronically Signed   By: Lowella Grip III M.D.   On: 08/11/2017 13:39   Ct Angio Chest Pe W And/or Wo Contrast  Result Date: 08/11/2017 CLINICAL DATA:  Shortness of breath for 13 days. History breast cancer. EXAM: CT ANGIOGRAPHY CHEST WITH CONTRAST TECHNIQUE: Multidetector CT imaging of the chest was performed using the standard protocol during bolus administration of intravenous contrast. Multiplanar CT image reconstructions and MIPs were obtained to evaluate the vascular anatomy. CONTRAST:  100 cc Isovue 370 intravenous COMPARISON:  10/02/2009 FINDINGS: Cardiovascular: Satisfactory opacification of the pulmonary arteries to the segmental level. No evidence of pulmonary embolism. Normal heart size. No pericardial effusion. Atherosclerotic calcifications seen along the aorta and left coronaries. Mediastinum/Nodes: Mild enlargement of the right juxta diaphragmatic and hilar lymph nodes, nonspecific in this setting. No generalized adenopathy. There is a right tracheoesophageal groove thyroid nodule that is stable if not decreased from 2010 CT. Lungs/Pleura: Large right pleural effusion with loculation. Except for the superior segment, the right lower lobe is collapsed with scalloping. There is also collapse and distortion of the right middle lobe. The aerated right lung is free of pneumonia  or nodularity. Emphysema, primarily paraseptal. There is likely some pleural thickening posteriorly and medially on the right. No discrete mass is seen. Small left lateral costophrenic sulcus nodule is stable from 2010 and benign. Upper Abdomen: No acute finding Musculoskeletal: Remote and healed proximal right humerus fracture. Review of the MIP images confirms the above findings. IMPRESSION: 1. Large and loculated right pleural effusion with multi segment collapse. There are areas of mild pleural thickening without discrete mass lesion. 2. Mild right hilar and juxta diaphragmatic adenopathy, significance to be determined based on thoracentesis. 3. Negative for pulmonary embolism. 4.  Aortic Atherosclerosis (ICD10-I70.0). 5.  Emphysema (ICD10-J43.9). Electronically Signed   By: Monte Fantasia M.D.   On: 08/11/2017 14:52   US Thoracentesis Asp Pleural Space W/img Guide  Result Date: 08/11/2017 INDICATION: RIGHT pleural effusion EXAM: ULTRASOUND GUIDED DIAGNOSTIC AND THERAPEUTIC RIGHT THORACENTESIS MEDICATIONS: None. COMPLICATIONS: None immediate. PROCEDURE: Procedure, benefits, and risks of procedure were discussed with patient. Written informed consent for procedure was obtained. Time out protocol followed. Pleural effusion localized by ultrasound at the posterior RIGHT hemithorax. Skin prepped and draped in usual sterile fashion. Skin and soft tissues anesthetized with 10 mL of 1% lidocaine. 8 French thoracentesis catheter placed into the RIGHT pleural space. 1.2 L of amber colored RIGHT pleural fluid aspirated by syringe pump. Procedure tolerated well by patient without immediate complication. FINDINGS: A total of approximately 1.2 L of RIGHT  pleural fluid was removed. Samples were sent to the laboratory as requested by the clinical team. IMPRESSION: Successful ultrasound guided RIGHT thoracentesis yielding 1.2 L of RIGHT pleural fluid. Electronically Signed   By: Lavonia Dana M.D.   On: 08/11/2017 17:12     ASSESSMENT: 71 y.o. [place] woman with a 37 P/Y smoking history and a large Right pleural effusion, cytology showing atypical cells that are ER negative, TTF1 positive  (1) history of Right breast cancer s/p lumpectomy 1998, followed by chemotherapy and radiation, all done at the [facility] in [place]-- the fact that she did not receive tamoxifen indicates that was an ER negative tumor  (2) she had a negative mammogram SEPT 2017 and a negative breast exam today; she had a colonoscopy under Dr Buford Dresser 08/10 with negative pathology; she had a CT angio 08/13 which shows no obvious source outside the lung; she also has no obvious symptoms of infection--that and the significant smoking history suggests this is cancer, likely a lung primary  PLAN: We spent the better part of today's hour-long visit discussing the biology of her diagnosis and the specifics of her situation. Harla understands we are concerned she has lung cancer, but the cytology from her 08/13 thoracentesis is inconclusive. We are going to need more tissue and if we are dealing with a primary lung cancer, as I suspect, we will need enough to send for a Foundation One study to determine if she is a candidate for targeted therapy.  Would it be possible for [facility] to obtain a biopsy for [place] at the time of their procedure Monday? Will they be doing a pleurodesis or only Pleurx placement? If the latter, that could be done as an outpatient--the patient is eager to be discharged. I will leave it to Dr Wendee Beavers to clarify those options.  The patient is aware that I believe she has lung cancer, that surgery and radiation are not likely to be useful given the presence of an effusion, and that she will need either chemotherapy or targeted oral therapy or both under the direction of an oncologist.  I will set [place] up for an outpatient PET scan at [facility]. I will arrange for outpatient follow-up for her at the [facility] in [place] (her choice). We have requested tumor markers and those may prove useful eventually though of course they cannot establish a diagnosis even if positive.  Please let me know if I can be of further help  Chauncey Cruel, MD   08/14/2017 6:29 PM Medical Oncology and Hematology [facility] [address] [place], [place] [address] Tel. [phone]    Fax. [phone]

## 2017-08-14 NOTE — Significant Event (Signed)
Patient wanted her code status changed to Full Code. Orders updated.  Gean Birchwood.

## 2017-08-15 LAB — BASIC METABOLIC PANEL
Anion gap: 10 (ref 5–15)
BUN: 17 mg/dL (ref 6–20)
CO2: 28 mmol/L (ref 22–32)
Calcium: 9 mg/dL (ref 8.9–10.3)
Chloride: 100 mmol/L — ABNORMAL LOW (ref 101–111)
Creatinine, Ser: 1.48 mg/dL — ABNORMAL HIGH (ref 0.44–1.00)
GFR calc Af Amer: 40 mL/min — ABNORMAL LOW (ref >60)
GFR calc non Af Amer: 34 mL/min — ABNORMAL LOW (ref >60)
Glucose, Bld: 165 mg/dL — ABNORMAL HIGH (ref 65–99)
Potassium: 3.9 mmol/L (ref 3.5–5.1)
Sodium: 138 mmol/L (ref 135–145)

## 2017-08-15 LAB — GLUCOSE, CAPILLARY
GLUCOSE-CAPILLARY: 250 mg/dL — AB (ref 65–99)
Glucose-Capillary: 155 mg/dL — ABNORMAL HIGH (ref 65–99)
Glucose-Capillary: 125 mg/dL — ABNORMAL HIGH (ref 65–99)
Glucose-Capillary: 247 mg/dL — ABNORMAL HIGH (ref 65–99)

## 2017-08-15 NOTE — Progress Notes (Signed)
PROGRESS NOTE    Kelly HLAVATY  VEH:[id number] DOB: 1946/08/24 DOA: 08/11/2017 PCP: [facility]     Brief Narrative:  71 year old woman admitted from home on 8/13 with complaints of shortness of breath. She has a remote history of breast cancer, tobacco abuse. She has been short of breath for about 2 weeks but today became acutely worse. Presented to the hospital where chest x-ray showed widespread airspace consolidation consistent with multifocal pneumonia on the right with possibility of lymphangitic spread of tumor in the left base. Subsequent CT chest showed a large loculated right pleural effusion with multisegment collapse. She underwent a thoracentesis yielding 1.2 L of fluid. Fluid studies are pending. Shortness of breath has improved.   Assessment & Plan:   Principal Problem:   Acute on chronic respiratory failure with hypoxia (HCC) Active Problems:   Normocytic anemia   Diabetes mellitus type 2 in nonobese [facility])   Essential hypertension   Pleural effusion   Tobacco dependence   Acute hypoxemic respiratory failure -Due to large loculated pleural effusion. -Potential etiologies include pneumonia plus minus recurrence of breast malignancy. -Cytology from thoracentesis has been obtained and reporting atypical cells that stain for TTF-1: probably malig effusion, consulted oncology and orders placed for CEA, CA 27-29, CA 19. CT of chest ordered - Cardiothoracic surgeon consulted and on board. Plan is for Pleurx catheter placement Monday. Should patient develop any worsening shortness of breath and we are to perform ultrasound guided thoracentesis - Continue current medical management  Tobacco abuse -Counseled on cessation   DVT prophylaxis: Lovenox Code Status: DO NOT RESUSCITATE Family Communication: Husband at bedside updated on plan of care and questions answered  Disposition Plan: Pending further recommendations by oncologist and  cardiothoracic surgeon. Most likely DC after Monday Consults  pulmonary  Cardiothoracic surgery  Oncology  Procedures:   None  Antimicrobials:  Anti-infectives    Start     Dose/Rate Route Frequency Ordered Stop   08/12/17 1800  vancomycin (VANCOCIN) IVPB 750 mg/150 ml premix     750 mg 150 mL/hr over 60 Minutes Intravenous Every 24 hours 08/11/17 2056     08/11/17 2200  piperacillin-tazobactam (ZOSYN) IVPB 3.375 g     3.375 g 12.5 mL/hr over 240 Minutes Intravenous Every 8 hours 08/11/17 2054     08/11/17 2100  vancomycin (VANCOCIN) IVPB 1000 mg/200 mL premix     1,000 mg 200 mL/hr over 60 Minutes Intravenous  Once 08/11/17 2053 08/11/17 2317   08/11/17 1500  cefTRIAXone (ROCEPHIN) 1 g in dextrose 5 % 50 mL IVPB     1 g 100 mL/hr over 30 Minutes Intravenous  Once 08/11/17 1458 08/11/17 1556   08/11/17 1500  azithromycin (ZITHROMAX) 500 mg in dextrose 5 % 250 mL IVPB     500 mg 250 mL/hr over 60 Minutes Intravenous  Once 08/11/17 1458 08/11/17 1702       Subjective: Patient reports no new problems.  Objective: Vitals:   08/14/17 1420 08/14/17 2300 08/15/17 0520 08/15/17 1300  BP: 115/61 123/68 117/62 123/81  Pulse: 92 85 88 92  Resp: 18 18 19 18   Temp: 97.9 F (36.6 C) 98.5 F (36.9 C) 98.4 F (36.9 C) 98.1 F (36.7 C)  TempSrc: Oral Oral  Oral  SpO2: 99% 95% 97% 100%  Weight:      Height:        Intake/Output Summary (Last 24 hours) at 08/15/17 1913 Last data filed at 08/15/17 1749  Gross per  24 hour  Intake             1020 ml  Output                0 ml  Net             1020 ml   Filed Weights   08/11/17 1237 08/12/17 1833  Weight: 50.8 kg (112 lb) 50.9 kg (112 lb 3.4 oz)    Examination:Exam unchanged compared 08/14/2017  General exam:Patient is alert and awake. No acute distress Respiratory system: Decreased breath sounds right mid lung fields down, no wheezes. Cardiovascular system: RRR. No murmurs, rubs, gallops. Gastrointestinal system:  Abdomen is nondistended, soft and nontender. No organomegaly or masses felt. Normal bowel sounds heard. Central nervous system: Alert and oriented. No focal neurological deficits. Extremities: No C/C/E, +pedal pulses Skin: No rashes, lesions or ulcers Psychiatry:  Mood & affect appropriate.   Data Reviewed: I have personally reviewed following labs and imaging studies  CBC:  Recent Labs Lab 08/11/17 1246 08/12/17 0542  WBC 11.3* 9.6  NEUTROABS 8.7*  --   HGB 10.0* 9.7*  HCT 31.3* 29.7*  MCV 87.4 87.9  PLT 356 536   Basic Metabolic Panel:  Recent Labs Lab 08/11/17 1246 08/12/17 0542 08/14/17 1053 08/15/17 0426  NA 135 134* 138 138  K 3.5 3.3* 3.6 3.9  CL 99* 98* 99* 100*  CO2 25 26 29 28   GLUCOSE 156* 130* 219* 165*  BUN 20 19 17 17   CREATININE 1.12* 1.18* 1.46* 1.48*  CALCIUM 8.9 8.6* 9.0 9.0   GFR: Estimated Creatinine Clearance: 28 mL/min (A) (by C-G formula based on SCr of 1.48 mg/dL (H)). Liver Function Tests:  Recent Labs Lab 08/11/17 1246  AST 15  ALT 11*  ALKPHOS 46  BILITOT 0.5  PROT 7.4  ALBUMIN 3.1*   No results for input(s): LIPASE, AMYLASE in the last 168 hours. No results for input(s): AMMONIA in the last 168 hours. Coagulation Profile: No results for input(s): INR, PROTIME in the last 168 hours. Cardiac Enzymes:  Recent Labs Lab 08/11/17 1246  TROPONINI <0.03   BNP (last 3 results) No results for input(s): PROBNP in the last 8760 hours. HbA1C: No results for input(s): HGBA1C in the last 72 hours. CBG:  Recent Labs Lab 08/14/17 1708 08/14/17 2225 08/15/17 0754 08/15/17 1221 08/15/17 1742  GLUCAP 193* 156* 155* 250* 125*   Lipid Profile: No results for input(s): CHOL, HDL, LDLCALC, TRIG, CHOLHDL, LDLDIRECT in the last 72 hours. Thyroid Function Tests: No results for input(s): TSH, T4TOTAL, FREET4, T3FREE, THYROIDAB in the last 72 hours. Anemia Panel: No results for input(s): VITAMINB12, FOLATE, FERRITIN, TIBC, IRON,  RETICCTPCT in the last 72 hours. Urine analysis: No results found for: COLORURINE, APPEARANCEUR, LABSPEC, PHURINE, GLUCOSEU, HGBUR, BILIRUBINUR, KETONESUR, PROTEINUR, UROBILINOGEN, NITRITE, LEUKOCYTESUR Sepsis Labs: @LABRCNTIP (procalcitonin:4,lacticidven:4)  ) Recent Results (from the past 240 hour(s))  Culture, body fluid-bottle     Status: None (Preliminary result)   Collection Time: 08/11/17  5:00 PM  Result Value Ref Range Status   Specimen Description PLEURAL COLLECTED BY DOCTOR  Final   Special Requests BOTTLES DRAWN AEROBIC AND ANAEROBIC 10 CC EACH  Final   Culture NO GROWTH 4 DAYS  Final   Report Status PENDING  Incomplete  Gram stain     Status: None   Collection Time: 08/11/17  5:00 PM  Result Value Ref Range Status   Specimen Description PLEURAL  Final   Special Requests NONE  Final  Gram Stain   Final    Performed at [place] [facility] WBC PRESENT, PREDOMINANTLY PMN    Report Status 08/11/2017 FINAL  Final  MRSA PCR Screening     Status: None   Collection Time: 08/11/17  8:33 PM  Result Value Ref Range Status   MRSA by PCR NEGATIVE NEGATIVE Final    Comment:        The GeneXpert MRSA Assay (FDA approved for NASAL specimens only), is one component of a comprehensive MRSA colonization surveillance program. It is not intended to diagnose MRSA infection nor to guide or monitor treatment for MRSA infections.          Radiology Studies: Ct Chest Wo Contrast  Result Date: 08/14/2017 CLINICAL DATA:  Post thoracentesis.  Shortness of breath. EXAM: CT CHEST WITHOUT CONTRAST TECHNIQUE: Multidetector CT imaging of the chest was performed following the standard protocol without IV contrast. COMPARISON:  Chest x-ray August 11, 2017 and chest CT August 11, 2017 FINDINGS: Cardiovascular: The thoracic aorta and central pulmonary arteries are unremarkable in caliber. Atherosclerosis in the aorta. Coronary artery calcifications are noted. No  cardiomegaly. Mediastinum/Nodes: Thyroid nodularity was described on the previous study and is stable. A prominent precarinal node measures 11 mm on series 3, image 60, similar in the interval. A small node in the epicardial fat on image 112 is stable as well. The esophagus is unremarkable. Lungs/Pleura: The central airways are unremarkable. No pneumothorax. The right-sided pleural effusion it is smaller in the interval but does remain with loculated components, particularly at the base but also laterally. There is also pleural thickening seen medially. The pleural thickening is mildly nodular in contour in some locations. Emphysematous changes are seen in the lungs. There is a small nodule in the lateral left lung base on series 4, image 113 measuring 6 mm and not seen on the recent comparison. No other suspicious findings seen in the left lung. Mild opacity is seen in the right upper lobe with a persistent nodule measuring 6 mm on image 32. Another tiny nodule seen in the right upper lobe on image 47, not clearly visualized previously, possibly due to the effusion. This nodule measures 4 mm. A probable nodule seen in the right base on image 56. Continued volume loss in the right middle lobe with improved aeration. Continued volume loss in the right base as well, also improved in the interval. Biapical pleuroparenchymal thickening is identified. Upper Abdomen: Thickening of the adrenal glands without discrete nodularity suggests hyperplasia. No other acute abnormalities. Musculoskeletal: No chest wall mass or suspicious bone lesions identified. IMPRESSION: 1. The patient is status post right thoracentesis. A moderate right-sided pleural effusion remains with loculated components. Pleural thickening, some of which is mildly nodular, persists on the right. Recommend correlation with thoracentesis. 2. A few mildly prominent nodes are identified in the precarinal and epicardial regions. See above for details. 3.  Atherosclerotic change in the thoracic aorta. Coronary artery calcifications. 4. Pulmonary nodularity as above with the largest nodule measuring 6 mm. Recommend attention on follow-up. Aortic Atherosclerosis (ICD10-I70.0). Electronically Signed   By: Dorise Bullion III M.D   On: 08/14/2017 21:39        Scheduled Meds: . buPROPion  150 mg Oral Daily  . docusate sodium  100 mg Oral BID  . enoxaparin (LOVENOX) injection  30 mg Subcutaneous Q24H  . hydrochlorothiazide  12.5 mg Oral Daily  . insulin aspart  0-5 Units Subcutaneous QHS  . insulin aspart  0-9  Units Subcutaneous TID WC  . loratadine  10 mg Oral Daily  . losartan  25 mg Oral Daily  . multivitamin with minerals  1 tablet Oral Daily  . omega-3 acid ethyl esters  1 capsule Oral Daily   Continuous Infusions: . piperacillin-tazobactam (ZOSYN)  IV Stopped (08/15/17 1747)  . vancomycin 750 mg (08/15/17 1749)     LOS: 4 days    Time spent: 35 minutes. Greater than 50% of this time was spent in direct contact with the patient coordinating care.   Velvet Bathe, MD [facility] Pager [phone]  If 7PM-7AM, please contact night-coverage [email or web address] Password [facility] 08/15/2017, 7:13 PM

## 2017-08-15 NOTE — Care Management Important Message (Signed)
Important Message  Patient Details  Name: Kelly Gay MRN: [id number] Date of Birth: 1946/11/14   Medicare Important Message Given:  Yes    Nathen May 08/15/2017, 11:27 AM

## 2017-08-16 ENCOUNTER — Inpatient Hospital Stay (HOSPITAL_COMMUNITY): Payer: Medicare Other

## 2017-08-16 LAB — GLUCOSE, CAPILLARY
GLUCOSE-CAPILLARY: 147 mg/dL — AB (ref 65–99)
GLUCOSE-CAPILLARY: 159 mg/dL — AB (ref 65–99)
Glucose-Capillary: 150 mg/dL — ABNORMAL HIGH (ref 65–99)
Glucose-Capillary: 203 mg/dL — ABNORMAL HIGH (ref 65–99)

## 2017-08-16 LAB — CULTURE, BODY FLUID W GRAM STAIN -BOTTLE: Culture: NO GROWTH

## 2017-08-16 LAB — CANCER ANTIGEN 27.29: CA 27.29: 16.5 U/mL (ref 0.0–38.6)

## 2017-08-16 LAB — CEA: CEA1: 3.9 ng/mL (ref 0.0–4.7)

## 2017-08-16 LAB — CULTURE, BODY FLUID-BOTTLE

## 2017-08-16 LAB — CANCER ANTIGEN 19-9: CA 19-9: 14 U/mL (ref 0–35)

## 2017-08-16 NOTE — Progress Notes (Signed)
PROGRESS NOTE    Kelly Gay  PFX:[id number] DOB: [birth year] DOA: 08/11/2017 PCP: [facility]     Brief Narrative:  71 year old woman admitted from home on 8/13 with complaints of shortness of breath. She has a remote history of breast cancer, tobacco abuse. She has been short of breath for about 2 weeks but today became acutely worse. Presented to the hospital where chest x-ray showed widespread airspace consolidation consistent with multifocal pneumonia on the right with possibility of lymphangitic spread of tumor in the left base. Subsequent CT chest showed a large loculated right pleural effusion with multisegment collapse. She underwent a thoracentesis yielding 1.2 L of fluid. Fluid studies are pending. Shortness of breath has improved.   Assessment & Plan:   Principal Problem:   Acute on chronic respiratory failure with hypoxia (HCC) Active Problems:   Normocytic anemia   Diabetes mellitus type 2 in nonobese [facility])   Essential hypertension   Pleural effusion   Tobacco dependence   Acute hypoxemic respiratory failure -Due to large loculated pleural effusion. -Potential etiologies include pneumonia plus minus recurrence of breast malignancy. -Cytology from thoracentesis has been obtained and reporting atypical cells that stain for TTF-1: probably malig effusion, consulted oncology and orders placed for CEA, CA 27-29, CA 19. CT of chest ordered - Cardiothoracic surgeon consulted and on board. Plan is for Pleurx catheter placement Monday. Should patient develop any worsening shortness of breath and we are to perform ultrasound guided thoracentesis - Continue current medical management, chest x ray reviewed and unchanged pt not complaining of worsening sob.  Tobacco abuse -Counseled on cessation   DVT prophylaxis: Lovenox Code Status: DO NOT RESUSCITATE Family Communication: Husband at bedside updated   Disposition Plan: Pending further  recommendations by oncologist and cardiothoracic surgeon. Most likely DC after Monday Consults  pulmonary  Cardiothoracic surgery  Oncology  Procedures:   None  Antimicrobials:  Anti-infectives    Start     Dose/Rate Route Frequency Ordered Stop   08/12/17 1800  vancomycin (VANCOCIN) IVPB 750 mg/150 ml premix     750 mg 150 mL/hr over 60 Minutes Intravenous Every 24 hours 08/11/17 2056     08/11/17 2200  piperacillin-tazobactam (ZOSYN) IVPB 3.375 g     3.375 g 12.5 mL/hr over 240 Minutes Intravenous Every 8 hours 08/11/17 2054     08/11/17 2100  vancomycin (VANCOCIN) IVPB 1000 mg/200 mL premix     1,000 mg 200 mL/hr over 60 Minutes Intravenous  Once 08/11/17 2053 08/11/17 2317   08/11/17 1500  cefTRIAXone (ROCEPHIN) 1 g in dextrose 5 % 50 mL IVPB     1 g 100 mL/hr over 30 Minutes Intravenous  Once 08/11/17 1458 08/11/17 1556   08/11/17 1500  azithromycin (ZITHROMAX) 500 mg in dextrose 5 % 250 mL IVPB     500 mg 250 mL/hr over 60 Minutes Intravenous  Once 08/11/17 1458 08/11/17 1702       Subjective: Patient reports no new problems. Denies worsening SOB.  Objective: Vitals:   08/15/17 1300 08/15/17 2046 08/16/17 0536 08/16/17 1427  BP: 123/81 117/79 121/65 112/65  Pulse: 92 91 90 97  Resp: 18 18 18 18   Temp: 98.1 F (36.7 C) 98.1 F (36.7 C) 98.4 F (36.9 C) 98.5 F (36.9 C)  TempSrc: Oral Oral Oral Oral  SpO2: 100% 98% 98% 97%  Weight:      Height:        Intake/Output Summary (Last 24 hours) at 08/16/17 1701  Last data filed at 08/16/17 0537  Gross per 24 hour  Intake              680 ml  Output                0 ml  Net              680 ml   Filed Weights   08/11/17 1237 08/12/17 1833  Weight: 50.8 kg (112 lb) 50.9 kg (112 lb 3.4 oz)    Examination:Exam unchanged compared 08/15/2017  General exam:Patient is alert and awake. No acute distress Respiratory system: Decreased breath sounds right mid lung fields down, no wheezes. Cardiovascular system:  RRR. No murmurs, rubs, gallops. Gastrointestinal system: Abdomen is nondistended, soft and nontender. No organomegaly or masses felt. Normal bowel sounds heard. Central nervous system: Alert and oriented. No focal neurological deficits. Extremities: No C/C/E, +pedal pulses Skin: No rashes, lesions or ulcers Psychiatry:  Mood & affect appropriate.   Data Reviewed: I have personally reviewed following labs and imaging studies  CBC:  Recent Labs Lab 08/11/17 1246 08/12/17 0542  WBC 11.3* 9.6  NEUTROABS 8.7*  --   HGB 10.0* 9.7*  HCT 31.3* 29.7*  MCV 87.4 87.9  PLT 356 433   Basic Metabolic Panel:  Recent Labs Lab 08/11/17 1246 08/12/17 0542 08/14/17 1053 08/15/17 0426  NA 135 134* 138 138  K 3.5 3.3* 3.6 3.9  CL 99* 98* 99* 100*  CO2 25 26 29 28   GLUCOSE 156* 130* 219* 165*  BUN 20 19 17 17   CREATININE 1.12* 1.18* 1.46* 1.48*  CALCIUM 8.9 8.6* 9.0 9.0   GFR: Estimated Creatinine Clearance: 28 mL/min (A) (by C-G formula based on SCr of 1.48 mg/dL (H)). Liver Function Tests:  Recent Labs Lab 08/11/17 1246  AST 15  ALT 11*  ALKPHOS 46  BILITOT 0.5  PROT 7.4  ALBUMIN 3.1*   No results for input(s): LIPASE, AMYLASE in the last 168 hours. No results for input(s): AMMONIA in the last 168 hours. Coagulation Profile: No results for input(s): INR, PROTIME in the last 168 hours. Cardiac Enzymes:  Recent Labs Lab 08/11/17 1246  TROPONINI <0.03   BNP (last 3 results) No results for input(s): PROBNP in the last 8760 hours. HbA1C: No results for input(s): HGBA1C in the last 72 hours. CBG:  Recent Labs Lab 08/15/17 1221 08/15/17 1742 08/15/17 2048 08/16/17 0843 08/16/17 1249  GLUCAP 250* 125* 247* 147* 150*   Lipid Profile: No results for input(s): CHOL, HDL, LDLCALC, TRIG, CHOLHDL, LDLDIRECT in the last 72 hours. Thyroid Function Tests: No results for input(s): TSH, T4TOTAL, FREET4, T3FREE, THYROIDAB in the last 72 hours. Anemia Panel: No results for  input(s): VITAMINB12, FOLATE, FERRITIN, TIBC, IRON, RETICCTPCT in the last 72 hours. Urine analysis: No results found for: COLORURINE, APPEARANCEUR, LABSPEC, PHURINE, GLUCOSEU, HGBUR, BILIRUBINUR, KETONESUR, PROTEINUR, UROBILINOGEN, NITRITE, LEUKOCYTESUR Sepsis Labs: @LABRCNTIP (procalcitonin:4,lacticidven:4)  ) Recent Results (from the past 240 hour(s))  Culture, body fluid-bottle     Status: None   Collection Time: 08/11/17  5:00 PM  Result Value Ref Range Status   Specimen Description PLEURAL COLLECTED BY DOCTOR  Final   Special Requests BOTTLES DRAWN AEROBIC AND ANAEROBIC 10 CC EACH  Final   Culture NO GROWTH 5 DAYS  Final   Report Status 08/16/2017 FINAL  Final  Gram stain     Status: None   Collection Time: 08/11/17  5:00 PM  Result Value Ref Range Status   Specimen Description PLEURAL  Final   Special Requests NONE  Final   Gram Stain   Final    Performed at [facility] WBC PRESENT, PREDOMINANTLY PMN    Report Status 08/11/2017 FINAL  Final  MRSA PCR Screening     Status: None   Collection Time: 08/11/17  8:33 PM  Result Value Ref Range Status   MRSA by PCR NEGATIVE NEGATIVE Final    Comment:        The GeneXpert MRSA Assay (FDA approved for NASAL specimens only), is one component of a comprehensive MRSA colonization surveillance program. It is not intended to diagnose MRSA infection nor to guide or monitor treatment for MRSA infections.          Radiology Studies: Dg Chest 2 View  Result Date: 08/16/2017 CLINICAL DATA:  Followup right pleural effusion. EXAM: CHEST  2 VIEW COMPARISON:  Chest CT, 08/14/2017.  Chest radiograph, 08/11/2017. FINDINGS: Opacity at the right lung base obscuring the right hemidiaphragm right heart border is stable consistent with a moderate-sized pleural effusion with associated atelectasis. There is persistent stable scarring most evident at the right apex. Lungs are hyperexpanded. No new lung abnormalities.  Cardiac silhouette is normal in size. No mediastinal or hilar masses. Skeletal structures are demineralized. There is an old fracture of the right proximal humerus. IMPRESSION: 1. No change from the recent prior studies. Persistent moderate right pleural effusion with associated lung base atelectasis. 2. No new lung abnormalities. Stable changes of lung scarring and COPD. Electronically Signed   By: Lajean Manes M.D.   On: 08/16/2017 08:16   Ct Chest Wo Contrast  Result Date: 08/14/2017 CLINICAL DATA:  Post thoracentesis.  Shortness of breath. EXAM: CT CHEST WITHOUT CONTRAST TECHNIQUE: Multidetector CT imaging of the chest was performed following the standard protocol without IV contrast. COMPARISON:  Chest x-ray August 11, 2017 and chest CT August 11, 2017 FINDINGS: Cardiovascular: The thoracic aorta and central pulmonary arteries are unremarkable in caliber. Atherosclerosis in the aorta. Coronary artery calcifications are noted. No cardiomegaly. Mediastinum/Nodes: Thyroid nodularity was described on the previous study and is stable. A prominent precarinal node measures 11 mm on series 3, image 60, similar in the interval. A small node in the epicardial fat on image 112 is stable as well. The esophagus is unremarkable. Lungs/Pleura: The central airways are unremarkable. No pneumothorax. The right-sided pleural effusion it is smaller in the interval but does remain with loculated components, particularly at the base but also laterally. There is also pleural thickening seen medially. The pleural thickening is mildly nodular in contour in some locations. Emphysematous changes are seen in the lungs. There is a small nodule in the lateral left lung base on series 4, image 113 measuring 6 mm and not seen on the recent comparison. No other suspicious findings seen in the left lung. Mild opacity is seen in the right upper lobe with a persistent nodule measuring 6 mm on image 32. Another tiny nodule seen in the right  upper lobe on image 47, not clearly visualized previously, possibly due to the effusion. This nodule measures 4 mm. A probable nodule seen in the right base on image 56. Continued volume loss in the right middle lobe with improved aeration. Continued volume loss in the right base as well, also improved in the interval. Biapical pleuroparenchymal thickening is identified. Upper Abdomen: Thickening of the adrenal glands without discrete nodularity suggests hyperplasia. No other acute abnormalities. Musculoskeletal: No chest wall mass or suspicious bone lesions identified.  IMPRESSION: 1. The patient is status post right thoracentesis. A moderate right-sided pleural effusion remains with loculated components. Pleural thickening, some of which is mildly nodular, persists on the right. Recommend correlation with thoracentesis. 2. A few mildly prominent nodes are identified in the precarinal and epicardial regions. See above for details. 3. Atherosclerotic change in the thoracic aorta. Coronary artery calcifications. 4. Pulmonary nodularity as above with the largest nodule measuring 6 mm. Recommend attention on follow-up. Aortic Atherosclerosis (ICD10-I70.0). Electronically Signed   By: Dorise Bullion III M.D   On: 08/14/2017 21:39        Scheduled Meds: . buPROPion  150 mg Oral Daily  . docusate sodium  100 mg Oral BID  . enoxaparin (LOVENOX) injection  30 mg Subcutaneous Q24H  . hydrochlorothiazide  12.5 mg Oral Daily  . insulin aspart  0-5 Units Subcutaneous QHS  . insulin aspart  0-9 Units Subcutaneous TID WC  . loratadine  10 mg Oral Daily  . losartan  25 mg Oral Daily  . multivitamin with minerals  1 tablet Oral Daily  . omega-3 acid ethyl esters  1 capsule Oral Daily   Continuous Infusions: . piperacillin-tazobactam (ZOSYN)  IV 3.375 g (08/16/17 1400)  . vancomycin 750 mg (08/15/17 1749)     LOS: 5 days    Time spent: > 35 minutes.   Velvet Bathe, MD [facility] Pager [phone]  If 7PM-7AM, please contact night-coverage [email or web address] Password [email or web address] 08/16/2017, 5:01 PM

## 2017-08-16 NOTE — Progress Notes (Signed)
Pharmacy Antibiotic Note  Kelly Gay is a 71 y.o. female admitted on 08/11/2017 with pneumonia.  Pharmacy has been consulted for [place] dosing.  Day #5 of broad spectrum abx for possible PNA. Concern still for lung cancer. Plan for Pleurx catheter placement on 8/20 from CVTS. Afeb, wbc down to wnl  Plan:  Continue vancomycin 750mg  IV q24h Continue Zosyn 3.375gm IV q8h (4h infusion) Monitor clinical picture, renal function, VT prn F/U C&S, abx deescalation / LOT  Consider stopping vancomycin as MRSA PCR negative?  Height: 5\' 4"  (162.6 cm) Weight: 112 lb 3.4 oz (50.9 kg) IBW/kg (Calculated) : 54.7  Temp (24hrs), Avg:98.2 F (36.8 C), Min:98.1 F (36.7 C), Max:98.4 F (36.9 C)   Recent Labs Lab 08/11/17 1246 08/12/17 0542 08/14/17 1053 08/15/17 0426  WBC 11.3* 9.6  --   --   CREATININE 1.12* 1.18* 1.46* 1.48*    Estimated Creatinine Clearance: 28 mL/min (A) (by C-G formula based on SCr of 1.48 mg/dL (H)).    Allergies  Allergen Reactions  . Incruse Ellipta [Umeclidinium Bromide]     Anxiety    Antimicrobials this admission:  Vancomycin 8/14 >>  Zosyn 8/14 >>   Dose adjustments this admission:   Microbiology results:  8/13 pleural fluid: ngtd 8/13 MRSA PCR: neg  Thank you for allowing pharmacy to be a part of this patient's care.  Reginia Naas 08/16/2017 8:28 AM

## 2017-08-17 LAB — GLUCOSE, CAPILLARY
GLUCOSE-CAPILLARY: 150 mg/dL — AB (ref 65–99)
Glucose-Capillary: 147 mg/dL — ABNORMAL HIGH (ref 65–99)
Glucose-Capillary: 252 mg/dL — ABNORMAL HIGH (ref 65–99)
Glucose-Capillary: 179 mg/dL — ABNORMAL HIGH (ref 65–99)

## 2017-08-17 LAB — VANCOMYCIN, TROUGH: VANCOMYCIN TR: 65 ug/mL — AB (ref 15–20)

## 2017-08-17 MED ORDER — VANCOMYCIN HCL IN DEXTROSE 750-5 MG/150ML-% IV SOLN
750.0000 mg | INTRAVENOUS | Status: DC
  Administered 2017-08-18: 750 mg via INTRAVENOUS
  Filled 2017-08-17: qty 150

## 2017-08-17 NOTE — Progress Notes (Signed)
All tumor markers are WNL-- CEA in particular is not elevated. CT chest non contrast post thoracentesis shows apparent nodularity in the left pleura.  Still need tissue. Hopefully can be obtained with tomorrow's procedure

## 2017-08-17 NOTE — Progress Notes (Signed)
PROGRESS NOTE    Kelly Gay  GLO:[id number] DOB: 1946-12-20 DOA: 08/11/2017 PCP: [facility]     Brief Narrative:  71 year old woman admitted from home on 8/13 with complaints of shortness of breath. She has a remote history of breast cancer, tobacco abuse. She has been short of breath for about 2 weeks but today became acutely worse. Presented to the hospital where chest x-ray showed widespread airspace consolidation consistent with multifocal pneumonia on the right with possibility of lymphangitic spread of tumor in the left base. Subsequent CT chest showed a large loculated right pleural effusion with multisegment collapse. She underwent a thoracentesis yielding 1.2 L of fluid. Fluid studies are pending. Shortness of breath has improved.   Assessment & Plan:   Principal Problem:   Acute on chronic respiratory failure with hypoxia (HCC) Active Problems:   Normocytic anemia   Diabetes mellitus type 2 in nonobese [facility])   Essential hypertension   Pleural effusion   Tobacco dependence   Acute hypoxemic respiratory failure -Due to large loculated pleural effusion. -Potential etiologies include pneumonia plus minus recurrence of breast malignancy. -Cytology from thoracentesis has been obtained and reporting atypical cells that stain for TTF-1: probably malig effusion, consulted oncology and orders placed for CEA, CA 27-29, CA 19. CT of chest ordered - Cardiothoracic surgeon consulted and on board. Plan is for Pleurx catheter placement Monday. Should patient develop any worsening shortness of breath and we are to perform ultrasound guided thoracentesis - Continue current medical management, chest x ray reviewed and unchanged pt still not complaining of worsening sob.  Tobacco abuse -Counseled on cessation   DVT prophylaxis: Lovenox Code Status: DO NOT RESUSCITATE Family Communication: Husband at bedside updated   Disposition Plan: Pending further  recommendations by oncologist and cardiothoracic surgeon. Most likely DC after Monday Consults  pulmonary  Cardiothoracic surgery  Oncology  Procedures:   None  Antimicrobials:  Anti-infectives    Start     Dose/Rate Route Frequency Ordered Stop   08/12/17 1800  vancomycin (VANCOCIN) IVPB 750 mg/150 ml premix     750 mg 150 mL/hr over 60 Minutes Intravenous Every 24 hours 08/11/17 2056     08/11/17 2200  piperacillin-tazobactam (ZOSYN) IVPB 3.375 g     3.375 g 12.5 mL/hr over 240 Minutes Intravenous Every 8 hours 08/11/17 2054     08/11/17 2100  vancomycin (VANCOCIN) IVPB 1000 mg/200 mL premix     1,000 mg 200 mL/hr over 60 Minutes Intravenous  Once 08/11/17 2053 08/11/17 2317   08/11/17 1500  cefTRIAXone (ROCEPHIN) 1 g in dextrose 5 % 50 mL IVPB     1 g 100 mL/hr over 30 Minutes Intravenous  Once 08/11/17 1458 08/11/17 1556   08/11/17 1500  azithromycin (ZITHROMAX) 500 mg in dextrose 5 % 250 mL IVPB     500 mg 250 mL/hr over 60 Minutes Intravenous  Once 08/11/17 1458 08/11/17 1702       Subjective: Patient reports no new problems. Denies worsening SOB.  Objective: Vitals:   08/16/17 1427 08/16/17 2056 08/17/17 0447 08/17/17 1434  BP: 112/65 119/75 126/74 114/69  Pulse: 97 91 93 99  Resp: 18 18 18 18   Temp: 98.5 F (36.9 C) 98.5 F (36.9 C) 98.6 F (37 C) 98.4 F (36.9 C)  TempSrc: Oral Oral Oral Oral  SpO2: 97% 97% 98% 97%  Weight:      Height:        Intake/Output Summary (Last 24 hours) at 08/17/17  [place] filed at 08/17/17 0600  Gross per 24 hour  Intake              390 ml  Output                0 ml  Net              390 ml   Filed Weights   08/11/17 1237 08/12/17 1833  Weight: 50.8 kg (112 lb) 50.9 kg (112 lb 3.4 oz)    Examination:Exam unchanged compared 08/16/2017  General exam:Patient is alert and awake. No acute distress Respiratory system: Decreased breath sounds right mid lung fields down, no wheezes. Cardiovascular system:  RRR. No murmurs, rubs, gallops. Gastrointestinal system: Abdomen is nondistended, soft and nontender. No organomegaly or masses felt. Normal bowel sounds heard. Central nervous system: Alert and oriented. No focal neurological deficits. Extremities: No C/C/E, +pedal pulses Skin: No rashes, lesions or ulcers Psychiatry:  Mood & affect appropriate.   Data Reviewed: I have personally reviewed following labs and imaging studies  CBC:  Recent Labs Lab 08/11/17 1246 08/12/17 0542  WBC 11.3* 9.6  NEUTROABS 8.7*  --   HGB 10.0* 9.7*  HCT 31.3* 29.7*  MCV 87.4 87.9  PLT 356 528   Basic Metabolic Panel:  Recent Labs Lab 08/11/17 1246 08/12/17 0542 08/14/17 1053 08/15/17 0426  NA 135 134* 138 138  K 3.5 3.3* 3.6 3.9  CL 99* 98* 99* 100*  CO2 25 26 29 28   GLUCOSE 156* 130* 219* 165*  BUN 20 19 17 17   CREATININE 1.12* 1.18* 1.46* 1.48*  CALCIUM 8.9 8.6* 9.0 9.0   GFR: Estimated Creatinine Clearance: 28 mL/min (A) (by C-G formula based on SCr of 1.48 mg/dL (H)). Liver Function Tests:  Recent Labs Lab 08/11/17 1246  AST 15  ALT 11*  ALKPHOS 46  BILITOT 0.5  PROT 7.4  ALBUMIN 3.1*   No results for input(s): LIPASE, AMYLASE in the last 168 hours. No results for input(s): AMMONIA in the last 168 hours. Coagulation Profile: No results for input(s): INR, PROTIME in the last 168 hours. Cardiac Enzymes:  Recent Labs Lab 08/11/17 1246  TROPONINI <0.03   BNP (last 3 results) No results for input(s): PROBNP in the last 8760 hours. HbA1C: No results for input(s): HGBA1C in the last 72 hours. CBG:  Recent Labs Lab 08/16/17 1249 08/16/17 1803 08/16/17 2056 08/17/17 0759 08/17/17 1232  GLUCAP 150* 203* 159* 150* 147*   Lipid Profile: No results for input(s): CHOL, HDL, LDLCALC, TRIG, CHOLHDL, LDLDIRECT in the last 72 hours. Thyroid Function Tests: No results for input(s): TSH, T4TOTAL, FREET4, T3FREE, THYROIDAB in the last 72 hours. Anemia Panel: No results for  input(s): VITAMINB12, FOLATE, FERRITIN, TIBC, IRON, RETICCTPCT in the last 72 hours. Urine analysis: No results found for: COLORURINE, APPEARANCEUR, LABSPEC, PHURINE, GLUCOSEU, HGBUR, BILIRUBINUR, KETONESUR, PROTEINUR, UROBILINOGEN, NITRITE, LEUKOCYTESUR Sepsis Labs: @LABRCNTIP (procalcitonin:4,lacticidven:4)  ) Recent Results (from the past 240 hour(s))  Culture, body fluid-bottle     Status: None   Collection Time: 08/11/17  5:00 PM  Result Value Ref Range Status   Specimen Description PLEURAL COLLECTED BY DOCTOR  Final   Special Requests BOTTLES DRAWN AEROBIC AND ANAEROBIC 10 CC EACH  Final   Culture NO GROWTH 5 DAYS  Final   Report Status 08/16/2017 FINAL  Final  Gram stain     Status: None   Collection Time: 08/11/17  5:00 PM  Result Value Ref Range Status   Specimen Description  PLEURAL  Final   Special Requests NONE  Final   Gram Stain   Final    Performed at [place] WBC PRESENT, PREDOMINANTLY PMN    Report Status 08/11/2017 FINAL  Final  MRSA PCR Screening     Status: None   Collection Time: 08/11/17  8:33 PM  Result Value Ref Range Status   MRSA by PCR NEGATIVE NEGATIVE Final    Comment:        The GeneXpert MRSA Assay (FDA approved for NASAL specimens only), is one component of a comprehensive MRSA colonization surveillance program. It is not intended to diagnose MRSA infection nor to guide or monitor treatment for MRSA infections.          Radiology Studies: Dg Chest 2 View  Result Date: 08/16/2017 CLINICAL DATA:  Followup right pleural effusion. EXAM: CHEST  2 VIEW COMPARISON:  Chest CT, 08/14/2017.  Chest radiograph, 08/11/2017. FINDINGS: Opacity at the right lung base obscuring the right hemidiaphragm right heart border is stable consistent with a moderate-sized pleural effusion with associated atelectasis. There is persistent stable scarring most evident at the right apex. Lungs are hyperexpanded. No new lung abnormalities.  Cardiac silhouette is normal in size. No mediastinal or hilar masses. Skeletal structures are demineralized. There is an old fracture of the right proximal humerus. IMPRESSION: 1. No change from the recent prior studies. Persistent moderate right pleural effusion with associated lung base atelectasis. 2. No new lung abnormalities. Stable changes of lung scarring and COPD. Electronically Signed   By: Lajean Manes M.D.   On: 08/16/2017 08:16        Scheduled Meds: . buPROPion  150 mg Oral Daily  . docusate sodium  100 mg Oral BID  . enoxaparin (LOVENOX) injection  30 mg Subcutaneous Q24H  . hydrochlorothiazide  12.5 mg Oral Daily  . insulin aspart  0-5 Units Subcutaneous QHS  . insulin aspart  0-9 Units Subcutaneous TID WC  . loratadine  10 mg Oral Daily  . losartan  25 mg Oral Daily  . multivitamin with minerals  1 tablet Oral Daily  . omega-3 acid ethyl esters  1 capsule Oral Daily   Continuous Infusions: . piperacillin-tazobactam (ZOSYN)  IV 3.375 g (08/17/17 1351)  . vancomycin Stopped (08/16/17 1906)     LOS: 6 days    Time spent: > 35 minutes.   Velvet Bathe, MD [facility] Pager [phone]  If 7PM-7AM, please contact night-coverage [email or web address] Password [email or web address] 08/17/2017, 3:05 PM

## 2017-08-17 NOTE — Progress Notes (Signed)
CRITICAL VALUE ALERT  Critical Value:  Vancomycin 65           & Time Notied:  08/17/17 at 1936 Provider Notified: Lamar Blinks Orders Received/Actions taken: hold vanco Jonni Sanger pharmacist aware too.

## 2017-08-17 NOTE — Progress Notes (Signed)
Pharmacy Antibiotic Note Kelly Gay is a 71 y.o. female admitted on 08/11/2017 with pneumonia.  Pharmacy has been consulted for [place] dosing. Day #6 of broad spectrum abx for possible PNA. Concern still for lung cancer. Plan for Pleurx catheter placement on 8/20 from CVTS.  Vancomycin trough drawn this is evening is erroneous as drawn during infusion of vancomycin.   Plan:  1. Continue vancomycin 750mg  IV q24h for now 2. Continue Zosyn 3.375gm IV q8h (4h infusion) 3. Will defer reordering vancomycin trough as hopeful to stop vancomycin soon with negative MRSA PCR and no positive culture data  Height: 5\' 4"  (162.6 cm) Weight: 112 lb 3.4 oz (50.9 kg) IBW/kg (Calculated) : 54.7  Temp (24hrs), Avg:98.5 F (36.9 C), Min:98.4 F (36.9 C), Max:98.6 F (37 C)   Recent Labs Lab 08/11/17 1246 08/12/17 0542 08/14/17 1053 08/15/17 0426 08/17/17 1805  WBC 11.3* 9.6  --   --   --   CREATININE 1.12* 1.18* 1.46* 1.48*  --   VANCOTROUGH  --   --   --   --  65*    Estimated Creatinine Clearance: 28 mL/min (A) (by C-G formula based on SCr of 1.48 mg/dL (H)).    Allergies  Allergen Reactions  . Oxycodone Nausea And Vomiting  . Incruse Ellipta [Umeclidinium Bromide] Anxiety   Antimicrobials this admission:  Vancomycin 8/14 >>  Zosyn 8/14 >>   Dose adjustments this admission:   Microbiology results:  8/13 pleural fluid: ngtd 8/13 MRSA PCR: neg  Thank you for allowing pharmacy to be a part of this patient's care.  Duayne Cal 08/17/2017 7:46 PM

## 2017-08-18 ENCOUNTER — Inpatient Hospital Stay (HOSPITAL_COMMUNITY): Payer: Medicare Other | Admitting: Certified Registered Nurse Anesthetist

## 2017-08-18 ENCOUNTER — Inpatient Hospital Stay (HOSPITAL_COMMUNITY): Payer: Medicare Other

## 2017-08-18 ENCOUNTER — Encounter (HOSPITAL_COMMUNITY)
Admission: EM | Disposition: A | Discharge status: Home Health | Payer: Self-pay | Source: Home / Self Care | Attending: Family Medicine

## 2017-08-18 ENCOUNTER — Encounter (HOSPITAL_COMMUNITY): Payer: Self-pay | Admitting: Certified Registered Nurse Anesthetist

## 2017-08-18 DIAGNOSIS — J90 Pleural effusion, not elsewhere classified: Secondary | ICD-10-CM

## 2017-08-18 HISTORY — PX: CHEST TUBE INSERTION: SHX231

## 2017-08-18 LAB — GLUCOSE, CAPILLARY
GLUCOSE-CAPILLARY: 163 mg/dL — AB (ref 65–99)
GLUCOSE-CAPILLARY: 270 mg/dL — AB (ref 65–99)
Glucose-Capillary: 104 mg/dL — ABNORMAL HIGH (ref 65–99)
Glucose-Capillary: 115 mg/dL — ABNORMAL HIGH (ref 65–99)
Glucose-Capillary: 312 mg/dL — ABNORMAL HIGH (ref 65–99)

## 2017-08-18 LAB — BASIC METABOLIC PANEL
Anion gap: 8 (ref 5–15)
BUN: 19 mg/dL (ref 6–20)
CO2: 27 mmol/L (ref 22–32)
Calcium: 9 mg/dL (ref 8.9–10.3)
Chloride: 101 mmol/L (ref 101–111)
Creatinine, Ser: 1.43 mg/dL — ABNORMAL HIGH (ref 0.44–1.00)
GFR calc Af Amer: 42 mL/min — ABNORMAL LOW (ref >60)
GFR calc non Af Amer: 36 mL/min — ABNORMAL LOW (ref >60)
Glucose, Bld: 168 mg/dL — ABNORMAL HIGH (ref 65–99)
Potassium: 3.7 mmol/L (ref 3.5–5.1)
Sodium: 136 mmol/L (ref 135–145)

## 2017-08-18 SURGERY — INSERTION, PLEURAL DRAINAGE CATHETER
Anesthesia: Monitor Anesthesia Care | Site: Chest | Laterality: Right

## 2017-08-18 MED ORDER — LIDOCAINE 2% (20 MG/ML) 5 ML SYRINGE
INTRAMUSCULAR | Status: AC
  Filled 2017-08-18: qty 5

## 2017-08-18 MED ORDER — 0.9 % SODIUM CHLORIDE (POUR BTL) OPTIME
TOPICAL | Status: DC | PRN
  Administered 2017-08-18: 1000 mL

## 2017-08-18 MED ORDER — TRAMADOL HCL 50 MG PO TABS
50.0000 mg | ORAL_TABLET | Freq: Four times a day (QID) | ORAL | Status: DC | PRN
  Administered 2017-08-18 – 2017-08-19 (×2): 50 mg via ORAL
  Filled 2017-08-18 (×2): qty 1

## 2017-08-18 MED ORDER — LIDOCAINE HCL 1 % IJ SOLN
INTRAMUSCULAR | Status: DC | PRN
  Administered 2017-08-18: 20 mL

## 2017-08-18 MED ORDER — MIDAZOLAM HCL 2 MG/2ML IJ SOLN
INTRAMUSCULAR | Status: AC
  Filled 2017-08-18: qty 2

## 2017-08-18 MED ORDER — ONDANSETRON HCL 4 MG/2ML IJ SOLN
INTRAMUSCULAR | Status: DC | PRN
  Administered 2017-08-18: 4 mg via INTRAVENOUS

## 2017-08-18 MED ORDER — PROPOFOL 500 MG/50ML IV EMUL
INTRAVENOUS | Status: DC | PRN
  Administered 2017-08-18: 50 ug/kg/min via INTRAVENOUS

## 2017-08-18 MED ORDER — DEXAMETHASONE SODIUM PHOSPHATE 10 MG/ML IJ SOLN
INTRAMUSCULAR | Status: DC | PRN
  Administered 2017-08-18: 5 mg via INTRAVENOUS

## 2017-08-18 MED ORDER — ONDANSETRON HCL 4 MG/2ML IJ SOLN
INTRAMUSCULAR | Status: AC
  Filled 2017-08-18: qty 2

## 2017-08-18 MED ORDER — PHENYLEPHRINE HCL 10 MG/ML IJ SOLN
INTRAMUSCULAR | Status: DC | PRN
  Administered 2017-08-18: 80 ug via INTRAVENOUS
  Administered 2017-08-18: 40 ug via INTRAVENOUS
  Administered 2017-08-18 (×2): 80 ug via INTRAVENOUS

## 2017-08-18 MED ORDER — FENTANYL CITRATE (PF) 100 MCG/2ML IJ SOLN: 25.0000 ug | INTRAMUSCULAR | Status: DC | PRN

## 2017-08-18 MED ORDER — DEXAMETHASONE SODIUM PHOSPHATE 10 MG/ML IJ SOLN
INTRAMUSCULAR | Status: AC
  Filled 2017-08-18: qty 1

## 2017-08-18 MED ORDER — LACTATED RINGERS IV SOLN
INTRAVENOUS | Status: DC
  Administered 2017-08-18: 12:00:00 via INTRAVENOUS
  Administered 2017-08-19: 1 mL via INTRAVENOUS

## 2017-08-18 MED ORDER — PROPOFOL 10 MG/ML IV BOLUS
INTRAVENOUS | Status: DC | PRN
  Administered 2017-08-18: 10 mg via INTRAVENOUS
  Administered 2017-08-18: 20 mg via INTRAVENOUS

## 2017-08-18 MED ORDER — FENTANYL CITRATE (PF) 100 MCG/2ML IJ SOLN
INTRAMUSCULAR | Status: DC | PRN
  Administered 2017-08-18 (×5): 25 ug via INTRAVENOUS

## 2017-08-18 MED ORDER — LIDOCAINE HCL (PF) 1 % IJ SOLN
INTRAMUSCULAR | Status: AC
  Filled 2017-08-18: qty 30

## 2017-08-18 MED ORDER — FENTANYL CITRATE (PF) 250 MCG/5ML IJ SOLN
INTRAMUSCULAR | Status: AC
  Filled 2017-08-18: qty 5

## 2017-08-18 MED ORDER — PHENYLEPHRINE 40 MCG/ML (10ML) SYRINGE FOR IV PUSH (FOR BLOOD PRESSURE SUPPORT)
PREFILLED_SYRINGE | INTRAVENOUS | Status: AC
  Filled 2017-08-18: qty 10

## 2017-08-18 SURGICAL SUPPLY — 41 items
ADH SKN CLS APL DERMABOND .7 (GAUZE/BANDAGES/DRESSINGS) ×1
CANISTER SUCT 3000ML PPV (MISCELLANEOUS) ×3 IMPLANT
CATH THORACIC 20FR (CATHETERS) ×2 IMPLANT
CONT SPEC 4OZ CLIKSEAL STRL BL (MISCELLANEOUS) ×2 IMPLANT
COVER SURGICAL LIGHT HANDLE (MISCELLANEOUS) ×3 IMPLANT
DERMABOND ADVANCED (GAUZE/BANDAGES/DRESSINGS) ×2
DERMABOND ADVANCED .7 DNX12 (GAUZE/BANDAGES/DRESSINGS) ×1 IMPLANT
DRAPE C-ARM 42X72 X-RAY (DRAPES) ×3 IMPLANT
DRAPE LAPAROSCOPIC ABDOMINAL (DRAPES) ×3 IMPLANT
DRSG ADAPTIC 3X8 NADH LF (GAUZE/BANDAGES/DRESSINGS) ×3 IMPLANT
GAUZE SPONGE 4X4 12PLY STRL LF (GAUZE/BANDAGES/DRESSINGS) ×3 IMPLANT
GLOVE BIO SURGEON STRL SZ7.5 (GLOVE) ×6 IMPLANT
GOWN STRL REUS W/ TWL LRG LVL3 (GOWN DISPOSABLE) ×2 IMPLANT
GOWN STRL REUS W/TWL LRG LVL3 (GOWN DISPOSABLE) ×6
KIT BASIN OR (CUSTOM PROCEDURE TRAY) ×3 IMPLANT
KIT PLEURX DRAIN CATH 1000ML (MISCELLANEOUS) ×6 IMPLANT
KIT PLEURX DRAIN CATH 15.5FR (DRAIN) ×5 IMPLANT
KIT ROOM TURNOVER OR (KITS) ×3 IMPLANT
NDL HYPO 25GX1X1/2 BEV (NEEDLE) ×1 IMPLANT
NEEDLE HYPO 25GX1X1/2 BEV (NEEDLE) ×3 IMPLANT
NS IRRIG 1000ML POUR BTL (IV SOLUTION) ×3 IMPLANT
PACK GENERAL/GYN (CUSTOM PROCEDURE TRAY) ×3 IMPLANT
PAD ARMBOARD 7.5X6 YLW CONV (MISCELLANEOUS) ×6 IMPLANT
SET DRAINAGE LINE (MISCELLANEOUS) IMPLANT
SPONGE INTESTINAL PEANUT (DISPOSABLE) ×2 IMPLANT
SUT ETHILON 3 0 PS 1 (SUTURE) ×3 IMPLANT
SUT SILK  1 MH (SUTURE) ×2
SUT SILK 1 MH (SUTURE) ×1 IMPLANT
SUT SILK 2 0 SH (SUTURE) ×3 IMPLANT
SUT SILK 2 0 SH CR/8 (SUTURE) ×2 IMPLANT
SUT VIC AB 2-0 CTX 27 (SUTURE) ×2 IMPLANT
SUT VIC AB 3-0 SH 8-18 (SUTURE) ×6 IMPLANT
SWAB COLLECTION DEVICE MRSA (MISCELLANEOUS) ×2 IMPLANT
SWAB CULTURE ESWAB REG 1ML (MISCELLANEOUS) ×2 IMPLANT
SYR CONTROL 10ML LL (SYRINGE) ×3 IMPLANT
TAPE CLOTH SURG 4X10 WHT LF (GAUZE/BANDAGES/DRESSINGS) ×2 IMPLANT
TOWEL OR 17X24 6PK STRL BLUE (TOWEL DISPOSABLE) ×3 IMPLANT
TOWEL OR 17X26 10 PK STRL BLUE (TOWEL DISPOSABLE) ×3 IMPLANT
TRAP SPECIMEN MUCOUS 40CC (MISCELLANEOUS) ×2 IMPLANT
VALVE REPLACEMENT CAP (MISCELLANEOUS) IMPLANT
WATER STERILE IRR 1000ML POUR (IV SOLUTION) ×3 IMPLANT

## 2017-08-18 NOTE — Progress Notes (Signed)
Pre Procedure note for inpatients:   Kelly Gay has been scheduled for Procedure(s): INSERTION PLEURAL DRAINAGE CATHETER (Right)  Right Pleural Biopsy today.   The various methods of treatment have been discussed with the patient. After consideration of the risks, benefits and treatment options the patient has consented to the planned procedure.   The patient has been seen and labs reviewed. There are no changes in the patient's condition to prevent proceeding with the planned procedure today.  Recent labs:  Lab Results  Component Value Date   WBC 9.6 08/12/2017   HGB 9.7 (L) 08/12/2017   HCT 29.7 (L) 08/12/2017   PLT 351 08/12/2017   GLUCOSE 168 (H) 08/18/2017   ALT 11 (L) 08/11/2017   AST 15 08/11/2017   NA 136 08/18/2017   K 3.7 08/18/2017   CL 101 08/18/2017   CREATININE 1.43 (H) 08/18/2017   BUN 19 08/18/2017   CO2 27 08/18/2017   HGBA1C 6.7 (H) 08/11/2017    Len Childs, MD 08/18/2017 12:01 PM

## 2017-08-18 NOTE — Anesthesia Postprocedure Evaluation (Signed)
Anesthesia Post Note  Patient: Kelly Gay  Procedure(s) Performed: Procedure(s) (LRB): INSERTION PLEURAL DRAINAGE CATHETER (Right)     Patient location during evaluation: PACU Anesthesia Type: MAC Level of consciousness: awake and alert Pain management: pain level controlled Vital Signs Assessment: post-procedure vital signs reviewed and stable Respiratory status: spontaneous breathing, nonlabored ventilation, respiratory function stable and patient connected to nasal cannula oxygen Cardiovascular status: stable and blood pressure returned to baseline Anesthetic complications: no    Last Vitals:  Vitals:   08/18/17 1430 08/18/17 1445  BP: 122/73 130/75  Pulse: 91 92  Resp: (!) 26 (!) 27  Temp:  36.5 C  SpO2: 92% 98%    Last Pain:  Vitals:   08/18/17 1430  TempSrc:   PainSc: 0-No pain                 Catalina Gravel

## 2017-08-18 NOTE — Anesthesia Preprocedure Evaluation (Addendum)
Anesthesia Evaluation  Patient identified by MRN, date of birth, ID band Patient awake    History of Anesthesia Complications Negative for: history of anesthetic complications  Airway Mallampati: II  TM Distance: >3 FB Neck ROM: Full    Dental no notable dental hx.    Pulmonary shortness of breath, COPD, former smoker,  P;eural effusion and resp compromise    + decreased breath sounds      Cardiovascular hypertension,  Rhythm:Regular Rate:Normal     Neuro/Psych negative neurological ROS     GI/Hepatic GERD  ,  Endo/Other  diabetes  Renal/GU      Musculoskeletal   Abdominal   Peds  Hematology  (+) anemia ,   Anesthesia Other Findings   Reproductive/Obstetrics                            Anesthesia Physical Anesthesia Plan  ASA: IV  Anesthesia Plan: MAC   Post-op Pain Management:    Induction: Intravenous  PONV Risk Score and Plan: 3 and Ondansetron, Dexamethasone, Propofol infusion and Treatment may vary due to age or medical condition  Airway Management Planned: Natural Airway and Simple Face Mask  Additional Equipment:   Intra-op Plan:   Post-operative Plan:   Informed Consent: I have reviewed the patients History and Physical, chart, labs and discussed the procedure including the risks, benefits and alternatives for the proposed anesthesia with the patient or authorized representative who has indicated his/her understanding and acceptance.   Dental advisory given  Plan Discussed with: CRNA  Anesthesia Plan Comments:         Anesthesia Quick Evaluation

## 2017-08-18 NOTE — Progress Notes (Signed)
Noted bubbling on the chest tube and noted a whistling sound around the chest tube connection . No SOB noted, O2 sat 100% on O2 at 2LPM. MD made aware and an order to disconnect tube to suction and put it to waterseal noted.

## 2017-08-18 NOTE — Progress Notes (Signed)
PROGRESS NOTE    Kelly Gay  RSW:[id number] DOB: 08-Nov-1946 DOA: 08/11/2017 PCP: [facility]     Brief Narrative:  71 year old woman admitted from home on 8/13 with complaints of shortness of breath. She has a remote history of breast cancer, tobacco abuse. She has been short of breath for about 2 weeks but today became acutely worse. Presented to the hospital where chest x-ray showed widespread airspace consolidation consistent with multifocal pneumonia on the right with possibility of lymphangitic spread of tumor in the left base. Subsequent CT chest showed a large loculated right pleural effusion with multisegment collapse. She underwent a thoracentesis yielding 1.2 L of fluid. Fluid studies are pending. Shortness of breath has improved.   Assessment & Plan:   Principal Problem:   Acute on chronic respiratory failure with hypoxia (HCC) Active Problems:   Normocytic anemia   Diabetes mellitus type 2 in nonobese [facility])   Essential hypertension   Pleural effusion   Tobacco dependence   Acute hypoxemic respiratory failure -Due to large loculated pleural effusion. -Potential etiologies include pneumonia plus minus recurrence of breast malignancy. -Cytology from thoracentesis has been obtained and reporting atypical cells that stain for TTF-1: probably malig effusion, consulted oncology and orders placed for CEA, CA 27-29, CA 19. CT of chest ordered - Cardiothoracic surgeon consulted and on board. Plan is for Pleurx catheter placement Monday. Should patient develop any worsening shortness of breath and we are to perform ultrasound guided thoracentesis - No increased sob reported today. Awaiting to go into surgery today for pleurx catheter placement.  Tobacco abuse -Counseled on cessation   DVT prophylaxis: Lovenox Code Status: DO NOT RESUSCITATE Family Communication: Husband at bedside updated   Disposition Plan: Pending further recommendations by  oncologist and cardiothoracic surgeon. Most likely DC after Monday Consults  pulmonary  Cardiothoracic surgery  Oncology  Procedures:   None  Antimicrobials:  Anti-infectives    Start     Dose/Rate Route Frequency Ordered Stop   08/18/17 1800  vancomycin (VANCOCIN) IVPB 750 mg/150 ml premix     750 mg 150 mL/hr over 60 Minutes Intravenous Every 24 hours 08/17/17 1943     08/12/17 1800  vancomycin (VANCOCIN) IVPB 750 mg/150 ml premix  Status:  Discontinued     750 mg 150 mL/hr over 60 Minutes Intravenous Every 24 hours 08/11/17 2056 08/17/17 1939   08/11/17 2200  piperacillin-tazobactam (ZOSYN) IVPB 3.375 g     3.375 g 12.5 mL/hr over 240 Minutes Intravenous Every 8 hours 08/11/17 2054     08/11/17 2100  vancomycin (VANCOCIN) IVPB 1000 mg/200 mL premix     1,000 mg 200 mL/hr over 60 Minutes Intravenous  Once 08/11/17 2053 08/11/17 2317   08/11/17 1500  cefTRIAXone (ROCEPHIN) 1 g in dextrose 5 % 50 mL IVPB     1 g 100 mL/hr over 30 Minutes Intravenous  Once 08/11/17 1458 08/11/17 1556   08/11/17 1500  azithromycin (ZITHROMAX) 500 mg in dextrose 5 % 250 mL IVPB     500 mg 250 mL/hr over 60 Minutes Intravenous  Once 08/11/17 1458 08/11/17 1702       Subjective: denies sob  Objective: Vitals:   08/18/17 1415 08/18/17 1430 08/18/17 1445 08/18/17 1510  BP: 120/69 122/73 130/75 138/67  Pulse: 88 91 92 92  Resp: 19 (!) 26 (!) 27 20  Temp:   97.7 F (36.5 C) 98.4 F (36.9 C)  TempSrc:    Oral  SpO2: 100% 92% 98%  95%  Weight:      Height:        Intake/Output Summary (Last 24 hours) at 08/18/17 1650 Last data filed at 08/18/17 1600  Gross per 24 hour  Intake          1434.17 ml  Output                2 ml  Net          1432.17 ml   Filed Weights   08/11/17 1237 08/12/17 1833  Weight: 50.8 kg (112 lb) 50.9 kg (112 lb 3.4 oz)    Examination:Exam unchanged compared 08/17/2017  General exam:Patient is alert and awake. No acute distress Respiratory system:  Decreased breath sounds right mid lung fields down, no wheezes. Cardiovascular system: RRR. No murmurs, rubs, gallops. Gastrointestinal system: Abdomen is nondistended, soft and nontender. No organomegaly or masses felt. Normal bowel sounds heard. Central nervous system: Alert and oriented. No focal neurological deficits. Extremities: No C/C/E, +pedal pulses Skin: No rashes, lesions or ulcers Psychiatry:  Mood & affect appropriate.   Data Reviewed: I have personally reviewed following labs and imaging studies  CBC:  Recent Labs Lab 08/12/17 0542  WBC 9.6  HGB 9.7*  HCT 29.7*  MCV 87.9  PLT 092   Basic Metabolic Panel:  Recent Labs Lab 08/12/17 0542 08/14/17 1053 08/15/17 0426 08/18/17 0559  NA 134* 138 138 136  K 3.3* 3.6 3.9 3.7  CL 98* 99* 100* 101  CO2 26 29 28 27   GLUCOSE 130* 219* 165* 168*  BUN 19 17 17 19   CREATININE 1.18* 1.46* 1.48* 1.43*  CALCIUM 8.6* 9.0 9.0 9.0   GFR: Estimated Creatinine Clearance: 29 mL/min (A) (by C-G formula based on SCr of 1.43 mg/dL (H)). Liver Function Tests: No results for input(s): AST, ALT, ALKPHOS, BILITOT, PROT, ALBUMIN in the last 168 hours. No results for input(s): LIPASE, AMYLASE in the last 168 hours. No results for input(s): AMMONIA in the last 168 hours. Coagulation Profile: No results for input(s): INR, PROTIME in the last 168 hours. Cardiac Enzymes: No results for input(s): CKTOTAL, CKMB, CKMBINDEX, TROPONINI in the last 168 hours. BNP (last 3 results) No results for input(s): PROBNP in the last 8760 hours. HbA1C: No results for input(s): HGBA1C in the last 72 hours. CBG:  Recent Labs Lab 08/17/17 1709 08/17/17 2135 08/18/17 0750 08/18/17 1113 08/18/17 1400  GLUCAP 252* 179* 163* 104* 115*   Lipid Profile: No results for input(s): CHOL, HDL, LDLCALC, TRIG, CHOLHDL, LDLDIRECT in the last 72 hours. Thyroid Function Tests: No results for input(s): TSH, T4TOTAL, FREET4, T3FREE, THYROIDAB in the last 72  hours. Anemia Panel: No results for input(s): VITAMINB12, FOLATE, FERRITIN, TIBC, IRON, RETICCTPCT in the last 72 hours. Urine analysis: No results found for: COLORURINE, APPEARANCEUR, LABSPEC, Remy, GLUCOSEU, HGBUR, BILIRUBINUR, KETONESUR, PROTEINUR, UROBILINOGEN, NITRITE, LEUKOCYTESUR Sepsis Labs: @LABRCNTIP (procalcitonin:4,lacticidven:4)  ) Recent Results (from the past 240 hour(s))  Culture, body fluid-bottle     Status: None   Collection Time: 08/11/17  5:00 PM  Result Value Ref Range Status   Specimen Description PLEURAL COLLECTED BY DOCTOR  Final   Special Requests BOTTLES DRAWN AEROBIC AND ANAEROBIC 10 CC EACH  Final   Culture NO GROWTH 5 DAYS  Final   Report Status 08/16/2017 FINAL  Final  Gram stain     Status: None   Collection Time: 08/11/17  5:00 PM  Result Value Ref Range Status   Specimen Description PLEURAL  Final   Special Requests  NONE  Final   Gram Stain   Final    Performed at [facility] WBC PRESENT, PREDOMINANTLY PMN    Report Status 08/11/2017 FINAL  Final  MRSA PCR Screening     Status: None   Collection Time: 08/11/17  8:33 PM  Result Value Ref Range Status   MRSA by PCR NEGATIVE NEGATIVE Final    Comment:        The GeneXpert MRSA Assay (FDA approved for NASAL specimens only), is one component of a comprehensive MRSA colonization surveillance program. It is not intended to diagnose MRSA infection nor to guide or monitor treatment for MRSA infections.   Body fluid culture     Status: None (Preliminary result)   Collection Time: 08/18/17 12:17 PM  Result Value Ref Range Status   Specimen Description FLUID PLEURAL RIGHT  Final   Special Requests ON SWABS POF VANC  Final   Gram Stain NO WBC SEEN NO ORGANISMS SEEN   Final   Culture PENDING  Incomplete   Report Status PENDING  Incomplete         Radiology Studies: Dg Chest Portable 1 View  Result Date: 08/18/2017 CLINICAL DATA:  Chest tube placement. EXAM:  PORTABLE CHEST 1 VIEW COMPARISON:  08/16/2017.  08/11/2017.  CT 08/14/2017. FINDINGS: Right chest tubes noted over the right chest. Mild improvement of right pleural effusion. No pneumothorax. Mediastinum and hilar structures normal. No acute bony abnormality. Atelectatic changes right lung base. Left lung is clear. Heart size normal. IMPRESSION: 1. Interim placement right chest tubes. Improvement of right pleural effusion. No pneumothorax. 2.  Atelectatic changes right lung base. Electronically Signed   By: Marcello Moores  Register   On: 08/18/2017 14:01   Dg C-arm 1-60 Min-no Report  Result Date: 08/18/2017 Fluoroscopy was utilized by the requesting physician.  No radiographic interpretation.        Scheduled Meds: . buPROPion  150 mg Oral Daily  . docusate sodium  100 mg Oral BID  . enoxaparin (LOVENOX) injection  30 mg Subcutaneous Q24H  . hydrochlorothiazide  12.5 mg Oral Daily  . insulin aspart  0-5 Units Subcutaneous QHS  . insulin aspart  0-9 Units Subcutaneous TID WC  . loratadine  10 mg Oral Daily  . losartan  25 mg Oral Daily  . multivitamin with minerals  1 tablet Oral Daily  . omega-3 acid ethyl esters  1 capsule Oral Daily   Continuous Infusions: . lactated ringers 50 mL/hr at 08/18/17 1146  . piperacillin-tazobactam (ZOSYN)  IV 3.375 g (08/18/17 1520)  . vancomycin       LOS: 7 days    Time spent: > 35 minutes.   Velvet Bathe, MD [facility] Pager [phone]  If 7PM-7AM, please contact night-coverage [email or web address] Password [email or web address] 08/18/2017, 4:50 PM

## 2017-08-18 NOTE — Brief Op Note (Signed)
08/11/2017 - 08/18/2017  1:59 PM  PATIENT:  Rexford Maus  71 y.o. female  PRE-OPERATIVE DIAGNOSIS:  RIGHT LOCULATED EFFUSION, hx breast cancer  POST-OPERATIVE DIAGNOSIS:  RIGHT LOCULATED EFFUSION, hx Right breast cancer  PROCEDURE:  Procedure(s): INSERTION PLEURAL DRAINAGE CATHETER (Right)  Drainage 700 ml pleural fluid Right Pleural biopsy  SURGEON:  Surgeon(s) and Role:    Ivin Poot, MD - Primary  PHYSICIAN ASSISTANT:   ASSISTANTS: none   ANESTHESIA:   local, IV sedation and MAC  EBL:  Total I/O In: 400 [I.V.:400] Out: 2 [Blood:2]  BLOOD ADMINISTERED:none  DRAINS: 20 F R pleural space Chest Tube(s) in the R pleural space   LOCAL MEDICATIONS USED:  LIDOCAINE  and Amount: 12 ml  SPECIMEN:  Aspirate and Excision  DISPOSITION OF SPECIMEN:  PATHOLOGY  COUNTS:  YES  TOURNIQUET:  * No tourniquets in log *  DICTATION: .Dragon Dictation  PLAN OF CARE: retrn to [facility] [place]  PATIENT DISPOSITION:  PACU - hemodynamically stable.   Delay start of Pharmacological VTE agent (>24hrs) due to surgical blood loss or risk of bleeding: yes

## 2017-08-18 NOTE — Transfer of Care (Signed)
Immediate Anesthesia Transfer of Care Note  Patient: Kelly Gay  Procedure(s) Performed: Procedure(s): INSERTION PLEURAL DRAINAGE CATHETER (Right)  Patient Location: PACU  Anesthesia Type:MAC  Level of Consciousness: awake, alert  and oriented  Airway & Oxygen Therapy: Patient Spontanous Breathing and Patient connected to face mask oxygen  Post-op Assessment: Report given to RN and Post -op Vital signs reviewed and stable  Post vital signs: Reviewed and stable  Last Vitals:  Vitals:   08/17/17 2201 08/18/17 0507  BP: 118/67 118/66  Pulse: 83 84  Resp: 18 17  Temp: 36.9 C 36.9 C  SpO2: 98% 98%    Last Pain:  Vitals:   08/18/17 0800  TempSrc:   PainSc: 0-No pain         Complications: No apparent anesthesia complications

## 2017-08-18 NOTE — Anesthesia Procedure Notes (Signed)
Procedure Name: MAC Date/Time: 08/18/2017 12:25 PM Performed by: Candis Shine Pre-anesthesia Checklist: Patient identified, Emergency Drugs available, Suction available, Patient being monitored and Timeout performed Patient Re-evaluated:Patient Re-evaluated prior to induction Oxygen Delivery Method: Simple face mask Dental Injury: Teeth and Oropharynx as per pre-operative assessment

## 2017-08-19 ENCOUNTER — Inpatient Hospital Stay (HOSPITAL_COMMUNITY): Payer: Medicare Other

## 2017-08-19 ENCOUNTER — Encounter (HOSPITAL_COMMUNITY): Payer: Self-pay | Admitting: Cardiothoracic Surgery

## 2017-08-19 LAB — GLUCOSE, CAPILLARY
GLUCOSE-CAPILLARY: 261 mg/dL — AB (ref 65–99)
GLUCOSE-CAPILLARY: 241 mg/dL — AB (ref 65–99)
GLUCOSE-CAPILLARY: 282 mg/dL — AB (ref 65–99)
Glucose-Capillary: 162 mg/dL — ABNORMAL HIGH (ref 65–99)

## 2017-08-19 NOTE — Care Management Note (Addendum)
Case Management Note  Patient Details  Name: Kelly Gay MRN: [id number] Date of Birth: August 28, 1946  Subjective/Objective:                    Action/Plan:  Patient would like [place] referral given to Santiago Glad with [facility] and accepted. Confirmed face sheet information with patient and husband at bedside. Patient's cell is [phone]. Choice offered. Patient would like [facility] called same spoke with Ivin Booty, they are not experienced in pleurx catheter therefore unable to accept referral. Patient and husband aware and are deciding on second choice. Will continue to follow.  Asked bedside nurse and charge nurse to order box of 1000 cc vacuum bottles. Patient will take these home at discharge. Patient aware and voiced understanding.  NCM completed [facility] paperwork  And faxed and mailed to [facility] for home supplies.  Expected Discharge Date:                  Expected Discharge Plan:  [place]  In-House Referral:     Discharge planning Services  CM Consult  Post Acute Care Choice:  Home Health Choice offered to:  Spouse, Patient  DME Arranged:    DME Agency:     HH Arranged:  RN [place] Agency:     Status of Service:  In process, will continue to follow  If discussed at Long Length of Stay Meetings, dates discussed:    Additional Comments:  Marilu Favre, RN 08/19/2017, 2:10 PM

## 2017-08-19 NOTE — Progress Notes (Signed)
PROGRESS NOTE    Kelly Gay  GMW:[id number] DOB: [birth year] DOA: 08/11/2017 PCP: [facility]   Brief Narrative:  71 year old woman admitted from home on 8/13 with complaints of shortness of breath. She has a remote history of breast cancer, tobacco abuse. She has been short of breath for about 2 weeks but today became acutely worse. Presented to the hospital where chest x-ray showed widespread airspace consolidation consistent with multifocal pneumonia on the right with possibility of lymphangitic spread of tumor in the left base. Subsequent CT chest showed a large loculated right pleural effusion with multisegment collapse. She underwent a thoracentesis yielding 1.2 L of fluid.   Fluids study suspicious for malignancy as cause. As such Cardiothoracic Surgeon placed Pleurx catheter. I consulted oncology who recommended CT of chest. CT of chest reported nodularity in the left pleura. Given that we needed tissue biopsy it was performed during procedure yesterday by cardiothoracic surgeon and patient had right pleural biopsy  Assessment & Plan:   Principal Problem:   Acute on chronic respiratory failure with hypoxia (HCC) Active Problems:   Normocytic anemia   Diabetes mellitus type 2 in nonobese [facility])   Essential hypertension   Pleural effusion   Tobacco dependence   Acute hypoxemic respiratory failure -Due to large loculated pleural effusion. -Potential etiologies include pneumonia plus minus recurrence of breast malignancy. -Cytology from thoracentesis has been obtained and reporting atypical cells that stain for TTF-1: probably malig effusion, consulted oncology and orders placed for CEA, CA 27-29, CA 19. CT of chest ordered - No increased sob reported today. - Cardiothoracic surgeon evaluated and procedure performed which was insertion of pleural drainage catheter with right pleural biopsy 08/18/2017 - Cardiothoracic surgeon following day plan on  discontinuing right chest tube today with possible discharge next a.m.  Tobacco abuse -Counseled on cessation   DVT prophylaxis: Lovenox Code Status: DO NOT RESUSCITATE Family Communication: Husband at bedside updated   Disposition Plan: Discharge once cleared by cardiothoracic surgeon patient will need close follow-up with oncology.   Consults  pulmonary  Cardiothoracic surgery  Oncology  Procedures:   None  Antimicrobials:  Anti-infectives    Start     Dose/Rate Route Frequency Ordered Stop   08/18/17 1800  vancomycin (VANCOCIN) IVPB 750 mg/150 ml premix  Status:  Discontinued     750 mg 150 mL/hr over 60 Minutes Intravenous Every 24 hours 08/17/17 1943 08/19/17 1051   08/12/17 1800  vancomycin (VANCOCIN) IVPB 750 mg/150 ml premix  Status:  Discontinued     750 mg 150 mL/hr over 60 Minutes Intravenous Every 24 hours 08/11/17 2056 08/17/17 1939   08/11/17 2200  piperacillin-tazobactam (ZOSYN) IVPB 3.375 g  Status:  Discontinued     3.375 g 12.5 mL/hr over 240 Minutes Intravenous Every 8 hours 08/11/17 2054 08/19/17 1051   08/11/17 2100  vancomycin (VANCOCIN) IVPB 1000 mg/200 mL premix     1,000 mg 200 mL/hr over 60 Minutes Intravenous  Once 08/11/17 2053 08/11/17 2317   08/11/17 1500  cefTRIAXone (ROCEPHIN) 1 g in dextrose 5 % 50 mL IVPB     1 g 100 mL/hr over 30 Minutes Intravenous  Once 08/11/17 1458 08/11/17 1556   08/11/17 1500  azithromycin (ZITHROMAX) 500 mg in dextrose 5 % 250 mL IVPB     500 mg 250 mL/hr over 60 Minutes Intravenous  Once 08/11/17 1458 08/11/17 1702       Subjective: Patient denies any worsening shortness of breath.  Objective: Vitals:  08/18/17 1510 08/18/17 2202 08/19/17 0619 08/19/17 1500  BP: 138/67 120/66 (!) 119/55 (!) 114/53  Pulse: 92 90 91 94  Resp: 20 19 18    Temp: 98.4 F (36.9 C) 98.3 F (36.8 C) 98 F (36.7 C) 99.2 F (37.3 C)  TempSrc: Oral Oral  Oral  SpO2: 95% 98% 100% 93%  Weight:      Height:         Intake/Output Summary (Last 24 hours) at 08/19/17 1856 Last data filed at 08/19/17 1700  Gross per 24 hour  Intake              290 ml  Output              251 ml  Net               39 ml   Filed Weights   08/11/17 1237 08/12/17 1833  Weight: 50.8 kg (112 lb) 50.9 kg (112 lb 3.4 oz)    Examination:  General exam:Patient is alert and awake. No acute distress Respiratory system: Chest tube in place, equal chest rise, no wheezes Cardiovascular system: RRR. No murmurs, rubs, gallops. Gastrointestinal system: Abdomen is nondistended, soft and nontender. No organomegaly or masses felt. Normal bowel sounds heard. Central nervous system: Alert and oriented. No focal neurological deficits. Extremities: No C/C/E, +pedal pulses Skin: No rashes, lesions or ulcers Psychiatry:  Mood & affect appropriate.   Data Reviewed: I have personally reviewed following labs and imaging studies  CBC: No results for input(s): WBC, NEUTROABS, HGB, HCT, MCV, PLT in the last 168 hours. Basic Metabolic Panel:  Recent Labs Lab 08/14/17 1053 08/15/17 0426 08/18/17 0559  NA 138 138 136  K 3.6 3.9 3.7  CL 99* 100* 101  CO2 29 28 27   GLUCOSE 219* 165* 168*  BUN 17 17 19   CREATININE 1.46* 1.48* 1.43*  CALCIUM 9.0 9.0 9.0   GFR: Estimated Creatinine Clearance: 29 mL/min (A) (by C-G formula based on SCr of 1.43 mg/dL (H)). Liver Function Tests: No results for input(s): AST, ALT, ALKPHOS, BILITOT, PROT, ALBUMIN in the last 168 hours. No results for input(s): LIPASE, AMYLASE in the last 168 hours. No results for input(s): AMMONIA in the last 168 hours. Coagulation Profile: No results for input(s): INR, PROTIME in the last 168 hours. Cardiac Enzymes: No results for input(s): CKTOTAL, CKMB, CKMBINDEX, TROPONINI in the last 168 hours. BNP (last 3 results) No results for input(s): PROBNP in the last 8760 hours. HbA1C: No results for input(s): HGBA1C in the last 72 hours. CBG:  Recent Labs Lab  08/18/17 1652 08/18/17 2149 08/19/17 0729 08/19/17 1223 08/19/17 1658  GLUCAP 270* 312* 162* 261* 241*   Lipid Profile: No results for input(s): CHOL, HDL, LDLCALC, TRIG, CHOLHDL, LDLDIRECT in the last 72 hours. Thyroid Function Tests: No results for input(s): TSH, T4TOTAL, FREET4, T3FREE, THYROIDAB in the last 72 hours. Anemia Panel: No results for input(s): VITAMINB12, FOLATE, FERRITIN, TIBC, IRON, RETICCTPCT in the last 72 hours. Urine analysis: No results found for: COLORURINE, APPEARANCEUR, LABSPEC, PHURINE, GLUCOSEU, HGBUR, BILIRUBINUR, KETONESUR, PROTEINUR, UROBILINOGEN, NITRITE, LEUKOCYTESUR Sepsis Labs: @LABRCNTIP (procalcitonin:4,lacticidven:4)  ) Recent Results (from the past 240 hour(s))  Culture, body fluid-bottle     Status: None   Collection Time: 08/11/17  5:00 PM  Result Value Ref Range Status   Specimen Description PLEURAL COLLECTED BY DOCTOR  Final   Special Requests BOTTLES DRAWN AEROBIC AND ANAEROBIC 10 CC EACH  Final   Culture NO GROWTH 5 DAYS  Final  Report Status 08/16/2017 FINAL  Final  Gram stain     Status: None   Collection Time: 08/11/17  5:00 PM  Result Value Ref Range Status   Specimen Description PLEURAL  Final   Special Requests NONE  Final   Gram Stain   Final    Performed at [facility] WBC PRESENT, PREDOMINANTLY PMN    Report Status 08/11/2017 FINAL  Final  MRSA PCR Screening     Status: None   Collection Time: 08/11/17  8:33 PM  Result Value Ref Range Status   MRSA by PCR NEGATIVE NEGATIVE Final    Comment:        The GeneXpert MRSA Assay (FDA approved for NASAL specimens only), is one component of a comprehensive MRSA colonization surveillance program. It is not intended to diagnose MRSA infection nor to guide or monitor treatment for MRSA infections.   Body fluid culture     Status: None (Preliminary result)   Collection Time: 08/18/17 12:17 PM  Result Value Ref Range Status   Specimen  Description FLUID PLEURAL RIGHT  Final   Special Requests ON SWABS POF VANC  Final   Gram Stain NO WBC SEEN NO ORGANISMS SEEN   Final   Culture NO GROWTH < 24 HOURS  Final   Report Status PENDING  Incomplete         Radiology Studies: Dg Chest Port 1 View  Result Date: 08/19/2017 CLINICAL DATA:  History of right-sided breast carcinoma and right PleurX catheter EXAM: PORTABLE CHEST 1 VIEW COMPARISON:  08/18/2017 FINDINGS: Cardiac shadow is within normal limits. The left lung is well aerated. A skin fold is noted over the lateral aspect of the left upper lobe. Persistent right-sided pleural effusion is seen. A PleurX catheter is noted as well as a right-sided chest tube. No pneumothorax is seen. Stable atelectatic changes are noted in the right base. IMPRESSION: No significant interval change from the prior exam. Two right-sided chest tubes are again seen. Electronically Signed   By: Inez Catalina M.D.   On: 08/19/2017 07:41   Dg Chest Portable 1 View  Result Date: 08/18/2017 CLINICAL DATA:  Chest tube placement. EXAM: PORTABLE CHEST 1 VIEW COMPARISON:  08/16/2017.  08/11/2017.  CT 08/14/2017. FINDINGS: Right chest tubes noted over the right chest. Mild improvement of right pleural effusion. No pneumothorax. Mediastinum and hilar structures normal. No acute bony abnormality. Atelectatic changes right lung base. Left lung is clear. Heart size normal. IMPRESSION: 1. Interim placement right chest tubes. Improvement of right pleural effusion. No pneumothorax. 2.  Atelectatic changes right lung base. Electronically Signed   By: Marcello Moores  Register   On: 08/18/2017 14:01   Dg C-arm 1-60 Min-no Report  Result Date: 08/18/2017 Fluoroscopy was utilized by the requesting physician.  No radiographic interpretation.        Scheduled Meds: . buPROPion  150 mg Oral Daily  . docusate sodium  100 mg Oral BID  . enoxaparin (LOVENOX) injection  30 mg Subcutaneous Q24H  . hydrochlorothiazide  12.5 mg Oral  Daily  . insulin aspart  0-5 Units Subcutaneous QHS  . insulin aspart  0-9 Units Subcutaneous TID WC  . loratadine  10 mg Oral Daily  . losartan  25 mg Oral Daily  . multivitamin with minerals  1 tablet Oral Daily  . omega-3 acid ethyl esters  1 capsule Oral Daily   Continuous Infusions: . lactated ringers 1 mL (08/19/17 0826)     LOS: 8 days  Time spent: > 35 minutes.   Velvet Bathe, MD [facility] Pager [phone]  If 7PM-7AM, please contact night-coverage [email or web address] Password [facility] 08/19/2017, 6:56 PM

## 2017-08-19 NOTE — Progress Notes (Signed)
      [address]       [place] [address]             [phone]        Procedure: Right chest tube removal   Right chest tube was removed per protocol. The patient tolerated the procedure well. CXR in the AM.     Nicholes Rough, PA-C

## 2017-08-19 NOTE — Progress Notes (Addendum)
      Kelly Gay       Kelly Gay,Kelly Gay [id number]             [phone]      1 Day Post-Op Procedure(s) (LRB): INSERTION PLEURAL DRAINAGE CATHETER (Right) Subjective: Feels okay this afternoon.  Objective: Vital signs in last 24 hours: Temp:  [97.3 F (36.3 C)-98.4 F (36.9 C)] 98 F (36.7 C) (08/21 0619) Pulse Rate:  [88-92] 91 (08/21 0619) Resp:  [18-30] 18 (08/21 0619) BP: (119-138)/(55-75) 119/55 (08/21 0619) SpO2:  [92 %-100 %] 100 % (08/21 0619)     Intake/Output from previous day: 08/20 0701 - 08/21 0700 In: 904.2 [P.O.:240; I.V.:514.2; IV Piggyback:150] Out: 3 [Urine:1; Blood:2] Intake/Output this shift: No intake/output data recorded.  General appearance: alert, cooperative and no distress Heart: regular rate and rhythm, S1, S2 normal, no murmur, click, rub or gallop Lungs: clear to auscultation bilaterally Abdomen: distended, hyperactive bowel sounds Extremities: extremities normal, atraumatic, no cyanosis or edema Wound: sterile dressing over pleurx catheter  Lab Results: No results for input(s): WBC, HGB, HCT, PLT in the last 72 hours. BMET:  Recent Labs  08/18/17 0559  NA 136  K 3.7  CL 101  CO2 27  GLUCOSE 168*  BUN 19  CREATININE 1.43*  CALCIUM 9.0    PT/INR: No results for input(s): LABPROT, INR in the last 72 hours. ABG No results found for: PHART, HCO3, TCO2, ACIDBASEDEF, O2SAT CBG (last 3)   Recent Labs  08/18/17 1652 08/18/17 2149 08/19/17 0729  GLUCAP 270* 312* 162*    Assessment/Plan: S/P Procedure(s) (LRB): INSERTION PLEURAL DRAINAGE CATHETER (Right)  1. CV-NSR in the 90s, BP well controlled.  2. Pulm-CXR this morning shows persistent right-sided pleural effusion. A Pleurx catheter and right-sided chest tube is noted. No pneumothorax. No airleak. Tolerating 2L Diagonal.   Plan: Discontinue right chest tube today. Home health order placed to assist with pleurx catheter drainage and sterile dressing change.  Possibly home tomorrow. Wean oxygen as tolerated. Case management to help arrange glass bottles for home drainage.     LOS: 8 days    Kelly Gay 08/19/2017 Plan chest tube removal later today Plan drainage of Pleurx catheter Wednesday then probable discharge home for outpatient management of right Pleurx catheter Leave sutures in place until seen back in office   patient examined and medical record reviewed,agree with above note. Kelly Gay 08/19/2017

## 2017-08-19 NOTE — Op Note (Signed)
NAME:  Kelly Gay, Kelly Gay                   ACCOUNT NO.:  MEDICAL RECORD NO.:  [id number]  LOCATION:                                 FACILITY:  PHYSICIAN:  Ivin Poot, M.D.       DATE OF BIRTH:  DATE OF PROCEDURE:  08/18/2017 DATE OF DISCHARGE:                              OPERATIVE REPORT   OPERATION: 1. Placement of right PleurX catheter. 2. Right pleural biopsy and placement of chest tube. 3. Drainage of 750 mL of right pleural effusion.  SURGEON:  Ivin Poot, M.D.  ANESTHESIA:  MAC with local 1% lidocaine.  PREOPERATIVE DIAGNOSIS:  Recurrent large right pleural effusion with cytology consistent with possible lung cancer.  POSTOPERATIVE DIAGNOSIS:  Recurrent large right pleural effusion with cytology consistent with possible lung cancer.  CLINICAL NOTE:  The patient is a 71 year old female smoker with prior history of breast cancer and right breast lumpectomy and radiation therapy.  She presented to an outside hospital with shortness of breath and had a thoracentesis, which removed 1.2 L of clear xanthochromic fluid.  Cytology showed atypical cells, some of which stained positive for TTF-1, a marker for lung cancer.  The patient was transferred to this hospital for further evaluation.  I evaluated the patient and felt that her effusion was probably malignant and not an empyema.  For that reason, I recommended a PleurX catheter to manage the symptoms.  She was seen by Oncology, recommended a pleural biopsy as well.  I prepared the patient for PleurX catheter placement and right pleural biopsy, discussed the procedure in detail and informed the patient of the expected benefits, alternatives, and risks.  DESCRIPTION OF PROCEDURE:  The patient was brought to the operating room and placed supine on the operating room table.  The right chest was bumped under a soft blanket.  The right chest was prepped and draped as a sterile field.  A proper time-out was performed.   The patient had been properly marked and informed consent had been obtained.  Lidocaine was infiltrated in the anterior axillary line at the fifth interspace.  A small incision was made.  Further lidocaine was infiltrated in the intercostal muscle.  Incision was taken down to clear the intercostal muscle and the parietal pleura was identified.  It was excised with a #15 blade scalpel with care being taken to avoid the intercostal vessels.  There was some fluid in the drain during this procedure.  The PleurX catheter was then placed.  A small incision above the pleural biopsy site using a tunnel technique where the catheter was brought out at the right costal margin.  The catheter was positioned with C-arm fluoroscopy and then drained 750 mL of clear fluid.  The two incisions were closed and the catheter was secured to the skin with a silk suture.  Finally, a small 20-French tube was placed anteriorly to remove any air entering the chest during the pleural biopsy.  This was secured to the skin and connected to a Pleur-Evac Drainage System.  The patient was reversed and then, a chest x-ray was taken showing good drainage of the effusion and no pneumothorax with good position  of the tubes.  The patient was then returned to the recovery room.     Ivin Poot, M.D.     PV/MEDQ  D:  08/18/2017  T:  08/18/2017  Job:  [id number]

## 2017-08-19 NOTE — Care Management Important Message (Signed)
Important Message  Patient Details  Name: Kelly Gay MRN: [id number] Date of Birth: Feb 02, 1946   Medicare Important Message Given:  Yes    Orbie Pyo 08/19/2017, 12:22 PM

## 2017-08-20 ENCOUNTER — Inpatient Hospital Stay (HOSPITAL_COMMUNITY): Payer: Medicare Other

## 2017-08-20 DIAGNOSIS — J9621 Acute and chronic respiratory failure with hypoxia: Secondary | ICD-10-CM

## 2017-08-20 DIAGNOSIS — E119 Type 2 diabetes mellitus without complications: Secondary | ICD-10-CM

## 2017-08-20 DIAGNOSIS — Z9889 Other specified postprocedural states: Secondary | ICD-10-CM

## 2017-08-20 DIAGNOSIS — F17200 Nicotine dependence, unspecified, uncomplicated: Secondary | ICD-10-CM

## 2017-08-20 DIAGNOSIS — N179 Acute kidney failure, unspecified: Secondary | ICD-10-CM

## 2017-08-20 LAB — BASIC METABOLIC PANEL
Anion gap: 8 (ref 5–15)
BUN: 17 mg/dL (ref 6–20)
CO2: 27 mmol/L (ref 22–32)
Calcium: 8.7 mg/dL — ABNORMAL LOW (ref 8.9–10.3)
Chloride: 101 mmol/L (ref 101–111)
Creatinine, Ser: 1.27 mg/dL — ABNORMAL HIGH (ref 0.44–1.00)
GFR calc Af Amer: 48 mL/min — ABNORMAL LOW (ref >60)
GFR calc non Af Amer: 41 mL/min — ABNORMAL LOW (ref >60)
Glucose, Bld: 142 mg/dL — ABNORMAL HIGH (ref 65–99)
Potassium: 3.6 mmol/L (ref 3.5–5.1)
Sodium: 136 mmol/L (ref 135–145)

## 2017-08-20 LAB — GLUCOSE, CAPILLARY
GLUCOSE-CAPILLARY: 155 mg/dL — AB (ref 65–99)
GLUCOSE-CAPILLARY: 199 mg/dL — AB (ref 65–99)

## 2017-08-20 MED ORDER — TRAMADOL HCL 50 MG PO TABS: 50.0000 mg | ORAL_TABLET | Freq: Four times a day (QID) | ORAL | 0 refills | Status: AC | PRN

## 2017-08-20 NOTE — Discharge Instructions (Signed)
Chest Tube Insertion, Adult, Care After This sheet gives you information about how to care for yourself after your procedure. Your health care provider may also give you more specific instructions. If you have problems or questions, contact your health care provider. What can I expect after the procedure? After the procedure, it is common to have:  Mild discomfort.  Slight bruising.  Follow these instructions at home: Incision care   Follow instructions from your health care provider about how to take care of your incision. Make sure you: ? Wash your hands with soap and water before you change your bandage (dressing). If soap and water are not available, use hand sanitizer. ? Change your dressing as told by your health care provider. ? Leave stitches (sutures), skin glue, or adhesive strips in place. These skin closures may need to stay in place for 2 weeks or longer. If adhesive strip edges start to loosen and curl up, you may trim the loose edges. Do not remove adhesive strips completely unless your health care provider tells you to do that.  Check your incision area every day for signs of infection. Check for: ? More redness, swelling, or pain. ? More fluid or blood. ? Warmth. ? Pus or a bad smell.  Do not take baths, swim, or use a hot tub until your health care provider approves. Ask your health care provider if you can take showers or have a sponge bath. You may need to keep the incision area dry for a few days. General instructions  Take over-the-counter and prescription medicines only as told by your health care provider.  Return to your normal activities as told by your health care provider. Ask your health care provider what activities are safe.  Keep all follow-up visits as told by your health care provider. This is important.  Do not drive for 24 hours if you were given a medicine to help you relax (sedative). Contact a health care provider if:  You have chills or  fever.  You have pain that is not controlled with medicine.  You have more redness, swelling, or pain around your incision.  You have more fluid or blood coming from your incision.  Your incision feels warm to the touch.  You have pus or a bad smell coming from the incision. Get help right away if:  You have chest pain or trouble breathing.  You develop red streaking on your skin around your incision. This information is not intended to replace advice given to you by your health care provider. Make sure you discuss any questions you have with your health care provider. Document Released: 10/06/2013 Document Revised: 07/05/2016 Document Reviewed: 05/29/2016 Elsevier Interactive Patient Education  2018 [employer].

## 2017-08-20 NOTE — Discharge Summary (Addendum)
Physician Discharge Summary  MENUCHA DICESARE GMW:[id number] DOB: [birth year] DOA: 08/11/2017  PCP: [facility] date: 08/11/2017 Discharge date: 08/20/2017  Admitted From: home Disposition:  home  Recommendations for Outpatient Follow-up:  1. Follow up with PCP in 1 week 2. Follow up with [facility] ( Dr vanTright ) in 2 weeks 3. Patient needs pleural fluid cytology sent x2, and results faxed to pcp or Dr Luan Pulling office   ( can be done by home health).   Home Health:RN Equipment/Devices: None  Discharge Condition: Fair CODE STATUS: Full code Diet recommendation: Carb Modified     Discharge Diagnoses:  Principal Problem:   Pleural effusion to the right  Active Problems:   Normocytic anemia   Acute on chronic respiratory failure with hypoxia (HCC)   Diabetes mellitus type 2 in nonobese [facility])   Essential hypertension   Tobacco dependence  Brief narrative/history of present illness Please refer to admission H&P for details, in brief,71 year old woman admitted from home on 8/13 with complaints of shortness of breath. She has a remote history of breast cancer, tobacco abuse. She has been short of breath for about 2 weeks but today became acutely worse. Presented to the hospital where chest x-ray showed widespread airspace consolidation consistent with multifocal pneumonia on the right with possibility of lymphangitic spread of tumor in the left base. Subsequent CT chest showed a large loculated right pleural effusion with multisegment collapse. She underwent a thoracentesis yielding 1.2 L of fluid.    Cardiothoracic Surgeon placed Pleurx catheter and also performed right pleural biopsy. Oncology was consulted who recommended CT of chest. CT of chest reported nodularity in the left pleura.   Hospital course Right pleural effusion, loculated (HCC)  -secondary to large loculated pleural effusion which was drained. Now maintaining sats on room air. -Right  pleural biopsy on 8/20 showed fibrous adipose tissue and chronic inflammatory cells without malignant cells. Also labs sent for CEA, CA 27-29 and CA 19-9 were all normal. -As per oncology patient had a negative mammogram in 08/2016, recent breast exam and colonoscopy (08/08/2017) which were negative. CT angiogram of the chest also negative for any source of malignancy.  -No clinical signs or symptoms of infection. Cultures have been negative. Given her smoking history of smoking this could be a lung primary. -As per oncology there is no clear evidence if her lung effusion is malignant. He suggests that sensitivity of cytology is about 50% but she samples can increase the sensitivity to 85%.   -Patient will be discharged home with right-sided Pleurx catheter. Home health nurse arranged. 2 other pleural fluid samples for cytology can be seen by home health nurse and results faxed either to PCP or pulmonary.  I have arranged an appointment for patient to see Dr. Luan Pulling (pulmonologist in [place]) in 2 weeks. -She will see cardiothoracic surgeon on 9/10 at 2:30 PM. -Provided instruction on Pleurx catheter care. Prescribed a short course of tramadol for pain.  Patient would like to follow-up at the [facility] in [place], if oncology follow-up needed.  Acute respiratory failure with hypoxia Secondary to right pleural effusion. Currently maintaining sats on room air.  Tobacco abuse Counseled on cessation.  Diabetes mellitus type 2 Resume metformin and glipizide  Essential hypertension Continue HCTZ and losartan  Acute kidney injury Mild. Now improved to normal. Follow-up repeat labs during outpatient visit.   Consults: Oncology ( Dr Jana Hakim) Thoracic surgery (Dr Lawson Fiscal)  Procedure CT chest Right-sided chest tube placement,  Pleurx catheter placement and pleural biopsy  Family communication: Husband at bedside Disposition: Home  Discharge  Instructions   Allergies as of 08/20/2017      Reactions   Oxycodone Nausea And Vomiting   Incruse Ellipta [umeclidinium Bromide] Anxiety      Medication List    TAKE these medications   buPROPion 150 MG 24 hr tablet Commonly known as:  WELLBUTRIN XL Take 150 mg by mouth daily.   CENTRUM ULTRA WOMENS Tabs Take 1 tablet by mouth daily.   cetirizine 10 MG tablet Commonly known as:  ZYRTEC Take 10 mg by mouth daily as needed for allergies.   FISH OIL ULTRA 1400 MG Caps Take 1,400 mg by mouth daily.   glipiZIDE 10 MG tablet Commonly known as:  GLUCOTROL Take 10 mg by mouth 2 (two) times daily.   hydrochlorothiazide 12.5 MG capsule Commonly known as:  MICROZIDE Take 12.5 mg by mouth daily.   ibuprofen 200 MG tablet Commonly known as:  ADVIL,MOTRIN Take 400 mg by mouth every 4 (four) hours as needed for headache or moderate pain.   losartan 25 MG tablet Commonly known as:  COZAAR Take 25 mg by mouth daily.   metFORMIN 1000 MG tablet Commonly known as:  GLUCOPHAGE Take 1,000 mg by mouth 2 (two) times daily.   traMADol 50 MG tablet Commonly known as:  ULTRAM Take 1 tablet (50 mg total) by mouth every 6 (six) hours as needed for moderate pain.            Discharge Care Instructions        Start     Ordered   08/20/17 0000  traMADol (ULTRAM) 50 MG tablet  Every 6 hours PRN     08/20/17 1428     Follow-up Information    [facility]. Call in 1 day(s).   Contact information: [address] [place] [address] [phone]        Ivin Poot, MD Follow up.   Specialty:  Cardiothoracic Surgery Why:  Your appointment is on 09/08/2017 at 2:30pm. Please arrive at 2:00pm for a chest xray located at [facility] which is on the first floor of our building.  Contact information: [address] [address] [place] [address] [address] [place] [address] [phone]        home health Follow up.   Why:  pleurx catheter drainage 2x a week        Sinda Du, MD Follow up in 2 week(s).   Specialty:  Pulmonary Disease Contact information: [address] [place] [address] [phone]          Allergies  Allergen Reactions  . Oxycodone Nausea And Vomiting  . Incruse Ellipta [Umeclidinium Bromide] Anxiety      Procedures/Studies: Dg Chest 1 View  Result Date: 08/11/2017 CLINICAL DATA:  Post RIGHT thoracentesis EXAM: CHEST 1 VIEW COMPARISON:  Earlier exam of 08/11/2017 FINDINGS: Normal heart size mediastinal contours. Atherosclerotic calcification aorta. Slight decrease in RIGHT pleural effusion and basilar atelectasis post thoracentesis removing 1.2 L of fluid. No pneumothorax. LEFT lung appears emphysematous but clear. Bones demineralized. IMPRESSION: Decrease in RIGHT pleural effusion and basilar atelectasis post thoracentesis without evidence of pneumothorax. Electronically Signed   By: Lavonia Dana M.D.   On: 08/11/2017 17:14   Dg Chest 2 View  Result Date: 08/16/2017 CLINICAL DATA:  Followup right pleural effusion. EXAM: CHEST  2 VIEW COMPARISON:  Chest CT, 08/14/2017.  Chest radiograph, 08/11/2017. FINDINGS: Opacity at  the right lung base obscuring the right hemidiaphragm right heart border is stable consistent with a moderate-sized pleural effusion with associated atelectasis. There is persistent stable scarring most evident at the right apex. Lungs are hyperexpanded. No new lung abnormalities. Cardiac silhouette is normal in size. No mediastinal or hilar masses. Skeletal structures are demineralized. There is an old fracture of the right proximal humerus. IMPRESSION: 1. No change from the recent prior studies. Persistent moderate right pleural effusion with associated lung base atelectasis. 2. No new lung abnormalities. Stable changes of lung scarring and COPD. Electronically Signed   By: Lajean Manes M.D.   On: 08/16/2017 08:16   Dg Chest 2 View  Result Date: 08/11/2017 CLINICAL DATA:  Shortness  of breath and chest pain. History of breast carcinoma EXAM: CHEST  2 VIEW COMPARISON:  Chest CT October 02, 2009 FINDINGS: There is extensive airspace consolidation throughout the right middle and lower lobes. There is also consolidation in the inferior aspect of the anterior segment right upper lobe. There may be a degree of intermixed pleural fluid. The left lung is clear except for trace left base interstitial edema. Heart size and pulmonary vascularity are normal. No evident adenopathy. No bone lesions. IMPRESSION: Widespread airspace consolidation consistent with multifocal pneumonia on the right. There may be a degree of pleural effusion intermixed with the consolidation. Trace interstitial edema left base. The possibility of mild lymphangitic spread of tumor in the left base must be of concern given history of breast carcinoma. Cardiac silhouette within normal limits. Electronically Signed   By: Lowella Grip III M.D.   On: 08/11/2017 13:39   Ct Chest Wo Contrast  Result Date: 08/14/2017 CLINICAL DATA:  Post thoracentesis.  Shortness of breath. EXAM: CT CHEST WITHOUT CONTRAST TECHNIQUE: Multidetector CT imaging of the chest was performed following the standard protocol without IV contrast. COMPARISON:  Chest x-ray August 11, 2017 and chest CT August 11, 2017 FINDINGS: Cardiovascular: The thoracic aorta and central pulmonary arteries are unremarkable in caliber. Atherosclerosis in the aorta. Coronary artery calcifications are noted. No cardiomegaly. Mediastinum/Nodes: Thyroid nodularity was described on the previous study and is stable. A prominent precarinal node measures 11 mm on series 3, image 60, similar in the interval. A small node in the epicardial fat on image 112 is stable as well. The esophagus is unremarkable. Lungs/Pleura: The central airways are unremarkable. No pneumothorax. The right-sided pleural effusion it is smaller in the interval but does remain with loculated components,  particularly at the base but also laterally. There is also pleural thickening seen medially. The pleural thickening is mildly nodular in contour in some locations. Emphysematous changes are seen in the lungs. There is a small nodule in the lateral left lung base on series 4, image 113 measuring 6 mm and not seen on the recent comparison. No other suspicious findings seen in the left lung. Mild opacity is seen in the right upper lobe with a persistent nodule measuring 6 mm on image 32. Another tiny nodule seen in the right upper lobe on image 47, not clearly visualized previously, possibly due to the effusion. This nodule measures 4 mm. A probable nodule seen in the right base on image 56. Continued volume loss in the right middle lobe with improved aeration. Continued volume loss in the right base as well, also improved in the interval. Biapical pleuroparenchymal thickening is identified. Upper Abdomen: Thickening of the adrenal glands without discrete nodularity suggests hyperplasia. No other acute abnormalities. Musculoskeletal: No chest wall mass or  suspicious bone lesions identified. IMPRESSION: 1. The patient is status post right thoracentesis. A moderate right-sided pleural effusion remains with loculated components. Pleural thickening, some of which is mildly nodular, persists on the right. Recommend correlation with thoracentesis. 2. A few mildly prominent nodes are identified in the precarinal and epicardial regions. See above for details. 3. Atherosclerotic change in the thoracic aorta. Coronary artery calcifications. 4. Pulmonary nodularity as above with the largest nodule measuring 6 mm. Recommend attention on follow-up. Aortic Atherosclerosis (ICD10-I70.0). Electronically Signed   By: Dorise Bullion III M.D   On: 08/14/2017 21:39   Ct Angio Chest Pe W And/or Wo Contrast  Result Date: 08/11/2017 CLINICAL DATA:  Shortness of breath for 13 days. History breast cancer. EXAM: CT ANGIOGRAPHY CHEST WITH  CONTRAST TECHNIQUE: Multidetector CT imaging of the chest was performed using the standard protocol during bolus administration of intravenous contrast. Multiplanar CT image reconstructions and MIPs were obtained to evaluate the vascular anatomy. CONTRAST:  100 cc Isovue 370 intravenous COMPARISON:  10/02/2009 FINDINGS: Cardiovascular: Satisfactory opacification of the pulmonary arteries to the segmental level. No evidence of pulmonary embolism. Normal heart size. No pericardial effusion. Atherosclerotic calcifications seen along the aorta and left coronaries. Mediastinum/Nodes: Mild enlargement of the right juxta diaphragmatic and hilar lymph nodes, nonspecific in this setting. No generalized adenopathy. There is a right tracheoesophageal groove thyroid nodule that is stable if not decreased from 2010 CT. Lungs/Pleura: Large right pleural effusion with loculation. Except for the superior segment, the right lower lobe is collapsed with scalloping. There is also collapse and distortion of the right middle lobe. The aerated right lung is free of pneumonia or nodularity. Emphysema, primarily paraseptal. There is likely some pleural thickening posteriorly and medially on the right. No discrete mass is seen. Small left lateral costophrenic sulcus nodule is stable from 2010 and benign. Upper Abdomen: No acute finding Musculoskeletal: Remote and healed proximal right humerus fracture. Review of the MIP images confirms the above findings. IMPRESSION: 1. Large and loculated right pleural effusion with multi segment collapse. There are areas of mild pleural thickening without discrete mass lesion. 2. Mild right hilar and juxta diaphragmatic adenopathy, significance to be determined based on thoracentesis. 3. Negative for pulmonary embolism. 4.  Aortic Atherosclerosis (ICD10-I70.0). 5.  Emphysema (ICD10-J43.9). Electronically Signed   By: Monte Fantasia M.D.   On: 08/11/2017 14:52   Dg Chest Port 1 View  Result Date:  08/20/2017 CLINICAL DATA:  Status post removal of the upper right chest tube. History of right pleural effusion. EXAM: PORTABLE CHEST 1 VIEW COMPARISON:  Portable chest x-ray of August 19, 2017 FINDINGS: The upper right chest tube has been removed. No pneumothorax is observed. The lower chest tube remains. A small right pleural effusion layers inferiorly and laterally and appears stable. The lung markings are coarse at the right lung base and to a lesser extent in the right mid lung laterally. The left lung is well-expanded and clear. There is no mediastinal shift. The heart and pulmonary vascularity are normal. There is calcification in the wall of the aortic arch. The observed bony thorax exhibits no acute abnormality. IMPRESSION: No significant further re-accumulation of pleural fluid development of a pneumothorax on the right is seen following upper chest tube removal. The small caliber right lower chest tube has its tip projecting in the medial cardiophrenic angle. Thoracic aortic atherosclerosis. Electronically Signed   By: David  Martinique M.D.   On: 08/20/2017 07:45   Dg Chest Port 1 View  Result Date:  08/19/2017 CLINICAL DATA:  History of right-sided breast carcinoma and right PleurX catheter EXAM: PORTABLE CHEST 1 VIEW COMPARISON:  08/18/2017 FINDINGS: Cardiac shadow is within normal limits. The left lung is well aerated. A skin fold is noted over the lateral aspect of the left upper lobe. Persistent right-sided pleural effusion is seen. A PleurX catheter is noted as well as a right-sided chest tube. No pneumothorax is seen. Stable atelectatic changes are noted in the right base. IMPRESSION: No significant interval change from the prior exam. Two right-sided chest tubes are again seen. Electronically Signed   By: Inez Catalina M.D.   On: 08/19/2017 07:41   Dg Chest Portable 1 View  Result Date: 08/18/2017 CLINICAL DATA:  Chest tube placement. EXAM: PORTABLE CHEST 1 VIEW COMPARISON:  08/16/2017.   08/11/2017.  CT 08/14/2017. FINDINGS: Right chest tubes noted over the right chest. Mild improvement of right pleural effusion. No pneumothorax. Mediastinum and hilar structures normal. No acute bony abnormality. Atelectatic changes right lung base. Left lung is clear. Heart size normal. IMPRESSION: 1. Interim placement right chest tubes. Improvement of right pleural effusion. No pneumothorax. 2.  Atelectatic changes right lung base. Electronically Signed   By: Marcello Moores  Register   On: 08/18/2017 14:01   Dg C-arm 1-60 Min-no Report  Result Date: 08/18/2017 Fluoroscopy was utilized by the requesting physician.  No radiographic interpretation.   US Thoracentesis Asp Pleural Space W/img Guide  Result Date: 08/11/2017 INDICATION: RIGHT pleural effusion EXAM: ULTRASOUND GUIDED DIAGNOSTIC AND THERAPEUTIC RIGHT THORACENTESIS MEDICATIONS: None. COMPLICATIONS: None immediate. PROCEDURE: Procedure, benefits, and risks of procedure were discussed with patient. Written informed consent for procedure was obtained. Time out protocol followed. Pleural effusion localized by ultrasound at the posterior RIGHT hemithorax. Skin prepped and draped in usual sterile fashion. Skin and soft tissues anesthetized with 10 mL of 1% lidocaine. 8 French thoracentesis catheter placed into the RIGHT pleural space. 1.2 L of amber colored RIGHT pleural fluid aspirated by syringe pump. Procedure tolerated well by patient without immediate complication. FINDINGS: A total of approximately 1.2 L of RIGHT pleural fluid was removed. Samples were sent to the laboratory as requested by the clinical team. IMPRESSION: Successful ultrasound guided RIGHT thoracentesis yielding 1.2 L of RIGHT pleural fluid. Electronically Signed   By: Lavonia Dana M.D.   On: 08/11/2017 17:12       Subjective: Reports some pain over Pleurx catheter site. Breathing oral much improved and able to ambulate in the hallway without oxygen.  Discharge Exam: Vitals:    08/19/17 2046 08/20/17 1353  BP: (!) 115/56 (!) 108/56  Pulse: 89 (!) 103  Resp: 19 18  Temp: 98.6 F (37 C) 98.4 F (36.9 C)  SpO2: 94% 99%   Vitals:   08/19/17 0619 08/19/17 1500 08/19/17 2046 08/20/17 1353  BP: (!) 119/55 (!) 114/53 (!) 115/56 (!) 108/56  Pulse: 91 94 89 (!) 103  Resp: 18  19 18   Temp: 98 F (36.7 C) 99.2 F (37.3 C) 98.6 F (37 C) 98.4 F (36.9 C)  TempSrc:  Oral Oral Oral  SpO2: 100% 93% 94% 99%  Weight:      Height:        General: Elderly thin built female not in distress HEENT: No pallor, moist mucosa, supple neck Chest: Right-sided Pleurx catheter, diminished breath sounds, no wheezes, rhonchi or crackles CVS: Normal S1 and S2, no murmurs GI: Soft, nondistended, nontender Musculoskeletal: Warm, no edema     The results of significant diagnostics from this hospitalization (including  imaging, microbiology, ancillary and laboratory) are listed below for reference.     Microbiology: Recent Results (from the past 240 hour(s))  Culture, body fluid-bottle     Status: None   Collection Time: 08/11/17  5:00 PM  Result Value Ref Range Status   Specimen Description PLEURAL COLLECTED BY DOCTOR  Final   Special Requests BOTTLES DRAWN AEROBIC AND ANAEROBIC 10 CC EACH  Final   Culture NO GROWTH 5 DAYS  Final   Report Status 08/16/2017 FINAL  Final  Gram stain     Status: None   Collection Time: 08/11/17  5:00 PM  Result Value Ref Range Status   Specimen Description PLEURAL  Final   Special Requests NONE  Final   Gram Stain   Final    Performed at [facility] WBC PRESENT, PREDOMINANTLY PMN    Report Status 08/11/2017 FINAL  Final  MRSA PCR Screening     Status: None   Collection Time: 08/11/17  8:33 PM  Result Value Ref Range Status   MRSA by PCR NEGATIVE NEGATIVE Final    Comment:        The GeneXpert MRSA Assay (FDA approved for NASAL specimens only), is one component of a comprehensive MRSA  colonization surveillance program. It is not intended to diagnose MRSA infection nor to guide or monitor treatment for MRSA infections.   Anaerobic culture     Status: None (Preliminary result)   Collection Time: 08/18/17 12:17 PM  Result Value Ref Range Status   Specimen Description FLUID PLEURAL RIGHT  Final   Special Requests ON SWABS POF VANC  Final   Gram Stain PENDING  Incomplete   Culture NO GROWTH 2 DAYS  Final   Report Status PENDING  Incomplete  Body fluid culture     Status: None (Preliminary result)   Collection Time: 08/18/17 12:17 PM  Result Value Ref Range Status   Specimen Description FLUID PLEURAL RIGHT  Final   Special Requests ON SWABS POF VANC  Final   Gram Stain NO WBC SEEN NO ORGANISMS SEEN   Final   Culture NO GROWTH < 24 HOURS  Final   Report Status PENDING  Incomplete     Labs: BNP (last 3 results)  Recent Labs  08/11/17 1241  BNP 40.9   Basic Metabolic Panel:  Recent Labs Lab 08/14/17 1053 08/15/17 0426 08/18/17 0559 08/20/17 0451  NA 138 138 136 136  K 3.6 3.9 3.7 3.6  CL 99* 100* 101 101  CO2 29 28 27 27   GLUCOSE 219* 165* 168* 142*  BUN 17 17 19 17   CREATININE 1.46* 1.48* 1.43* 1.27*  CALCIUM 9.0 9.0 9.0 8.7*   Liver Function Tests: No results for input(s): AST, ALT, ALKPHOS, BILITOT, PROT, ALBUMIN in the last 168 hours. No results for input(s): LIPASE, AMYLASE in the last 168 hours. No results for input(s): AMMONIA in the last 168 hours. CBC: No results for input(s): WBC, NEUTROABS, HGB, HCT, MCV, PLT in the last 168 hours. Cardiac Enzymes: No results for input(s): CKTOTAL, CKMB, CKMBINDEX, TROPONINI in the last 168 hours. BNP: Invalid input(s): POCBNP CBG:  Recent Labs Lab 08/19/17 1223 08/19/17 1658 08/19/17 2121 08/20/17 0725 08/20/17 1146  GLUCAP 261* 241* 282* 155* 199*   D-Dimer No results for input(s): DDIMER in the last 72 hours. Hgb A1c No results for input(s): HGBA1C in the last 72 hours. Lipid  Profile No results for input(s): CHOL, HDL, LDLCALC, TRIG, CHOLHDL, LDLDIRECT in the last 72  hours. Thyroid function studies No results for input(s): TSH, T4TOTAL, T3FREE, THYROIDAB in the last 72 hours.  Invalid input(s): FREET3 Anemia work up No results for input(s): VITAMINB12, FOLATE, FERRITIN, TIBC, IRON, RETICCTPCT in the last 72 hours. Urinalysis No results found for: COLORURINE, APPEARANCEUR, [place], [place], [place], [place], [place], [place], [place], UROBILINOGEN, NITRITE, LEUKOCYTESUR Sepsis Labs Invalid input(s): PROCALCITONIN,  WBC,  LACTICIDVEN Microbiology Recent Results (from the past 240 hour(s))  Culture, body fluid-bottle     Status: None   Collection Time: 08/11/17  5:00 PM  Result Value Ref Range Status   Specimen Description PLEURAL COLLECTED BY DOCTOR  Final   Special Requests BOTTLES DRAWN AEROBIC AND ANAEROBIC 10 CC EACH  Final   Culture NO GROWTH 5 DAYS  Final   Report Status 08/16/2017 FINAL  Final  Gram stain     Status: None   Collection Time: 08/11/17  5:00 PM  Result Value Ref Range Status   Specimen Description PLEURAL  Final   Special Requests NONE  Final   Gram Stain   Final    Performed at [facility] WBC PRESENT, PREDOMINANTLY PMN    Report Status 08/11/2017 FINAL  Final  MRSA PCR Screening     Status: None   Collection Time: 08/11/17  8:33 PM  Result Value Ref Range Status   MRSA by PCR NEGATIVE NEGATIVE Final    Comment:        The GeneXpert MRSA Assay (FDA approved for NASAL specimens only), is one component of a comprehensive MRSA colonization surveillance program. It is not intended to diagnose MRSA infection nor to guide or monitor treatment for MRSA infections.   Anaerobic culture     Status: None (Preliminary result)   Collection Time: 08/18/17 12:17 PM  Result Value Ref Range Status   Specimen Description FLUID PLEURAL RIGHT  Final   Special Requests ON SWABS POF VANC  Final   Gram  Stain PENDING  Incomplete   Culture NO GROWTH 2 DAYS  Final   Report Status PENDING  Incomplete  Body fluid culture     Status: None (Preliminary result)   Collection Time: 08/18/17 12:17 PM  Result Value Ref Range Status   Specimen Description FLUID PLEURAL RIGHT  Final   Special Requests ON SWABS POF VANC  Final   Gram Stain NO WBC SEEN NO ORGANISMS SEEN   Final   Culture NO GROWTH < 24 HOURS  Final   Report Status PENDING  Incomplete     Time coordinating discharge: Over 30 minutes  SIGNED:   Louellen Molder, MD  [facility] 08/20/2017, 2:31 PM Pager   If 7PM-7AM, please contact night-coverage [email or web address] Password [email or web address]

## 2017-08-20 NOTE — Progress Notes (Signed)
Kelly Gay   DOB:July 08, 1946   JM#:[id number]   QIW#:[id number]  Subjective:  Kelly Gay tolerated her Pleurx placement and pleural Bx well. She has many appropriate questions re follow-up and restrictions. Husband in room   Objective: middle aged White woman examined at bedside Vitals:   08/19/17 1500 08/19/17 2046  BP: (!) 114/53 (!) 115/56  Pulse: 94 89  Resp:  19  Temp: 99.2 F (37.3 C) 98.6 F (37 C)  SpO2: 93% 94%    Body mass index is 19.26 kg/m.  Intake/Output Summary (Last 24 hours) at 08/20/17 4174 Last data filed at 08/19/17 2141  Gross per 24 hour  Intake              240 ml  Output              250 ml  Net              -10 ml       CBG (last 3)   Recent Labs  08/19/17 1223 08/19/17 1658 08/19/17 2121  GLUCAP 261* 241* 282*     Labs:  Lab Results  Component Value Date   WBC 9.6 08/12/2017   HGB 9.7 (L) 08/12/2017   HCT 29.7 (L) 08/12/2017   MCV 87.9 08/12/2017   PLT 351 08/12/2017   NEUTROABS 8.7 (H) 08/11/2017    @LASTCHEMISTRY @  Urine Studies No results for input(s): UHGB, CRYS in the last 72 hours.  Invalid input(s): UACOL, UAPR, USPG, UPH, UTP, UGL, UKET, UBIL, UNIT, UROB, ULEU, UEPI, UWBC, Kelly Gay, Kelly Gay  Basic Metabolic Panel:  Recent Labs Lab 08/14/17 1053 08/15/17 0426 08/18/17 0559 08/20/17 0451  NA 138 138 136 136  K 3.6 3.9 3.7 3.6  CL 99* 100* 101 101  CO2 29 28 27 27   GLUCOSE 219* 165* 168* 142*  BUN 17 17 19 17   CREATININE 1.46* 1.48* 1.43* 1.27*  CALCIUM 9.0 9.0 9.0 8.7*   GFR Estimated Creatinine Clearance: 32.6 mL/min (A) (by C-G formula based on SCr of 1.27 mg/dL (H)). Liver Function Tests: No results for input(s): AST, ALT, ALKPHOS, BILITOT, PROT, ALBUMIN in the last 168 hours. No results for input(s): LIPASE, AMYLASE in the last 168 hours. No results for input(s): AMMONIA in the last 168 hours. Coagulation profile No results for input(s): INR, PROTIME in the last 168 hours.  CBC: No  results for input(s): WBC, NEUTROABS, HGB, HCT, MCV, PLT in the last 168 hours. Cardiac Enzymes: No results for input(s): CKTOTAL, CKMB, CKMBINDEX, TROPONINI in the last 168 hours. BNP: Invalid input(s): POCBNP CBG:  Recent Labs Lab 08/18/17 2149 08/19/17 0729 08/19/17 1223 08/19/17 1658 08/19/17 2121  GLUCAP 312* 162* 261* 241* 282*   D-Dimer No results for input(s): DDIMER in the last 72 hours. Hgb A1c No results for input(s): HGBA1C in the last 72 hours. Lipid Profile No results for input(s): CHOL, HDL, LDLCALC, TRIG, CHOLHDL, LDLDIRECT in the last 72 hours. Thyroid function studies No results for input(s): TSH, T4TOTAL, T3FREE, THYROIDAB in the last 72 hours.  Invalid input(s): FREET3 Anemia work up No results for input(s): VITAMINB12, FOLATE, FERRITIN, TIBC, IRON, RETICCTPCT in the last 72 hours. Microbiology Recent Results (from the past 240 hour(s))  Culture, body fluid-bottle     Status: None   Collection Time: 08/11/17  5:00 PM  Result Value Ref Range Status   Specimen Description PLEURAL COLLECTED BY DOCTOR  Final   Special Requests BOTTLES DRAWN AEROBIC AND ANAEROBIC 10 CC EACH  Final  Culture NO GROWTH 5 DAYS  Final   Report Status 08/16/2017 FINAL  Final  Gram stain     Status: None   Collection Time: 08/11/17  5:00 PM  Result Value Ref Range Status   Specimen Description PLEURAL  Final   Special Requests NONE  Final   Gram Stain   Final    Performed at [facility] WBC PRESENT, PREDOMINANTLY PMN    Report Status 08/11/2017 FINAL  Final  MRSA PCR Screening     Status: None   Collection Time: 08/11/17  8:33 PM  Result Value Ref Range Status   MRSA by PCR NEGATIVE NEGATIVE Final    Comment:        The GeneXpert MRSA Assay (FDA approved for NASAL specimens only), is one component of a comprehensive MRSA colonization surveillance program. It is not intended to diagnose MRSA infection nor to guide or monitor treatment  for MRSA infections.   Body fluid culture     Status: None (Preliminary result)   Collection Time: 08/18/17 12:17 PM  Result Value Ref Range Status   Specimen Description FLUID PLEURAL RIGHT  Final   Special Requests ON SWABS POF VANC  Final   Gram Stain NO WBC SEEN NO ORGANISMS SEEN   Final   Culture NO GROWTH < 24 HOURS  Final   Report Status PENDING  Incomplete      Studies:  Dg Chest Port 1 View  Result Date: 08/19/2017 CLINICAL DATA:  History of right-sided breast carcinoma and right PleurX catheter EXAM: PORTABLE CHEST 1 VIEW COMPARISON:  08/18/2017 FINDINGS: Cardiac shadow is within normal limits. The left lung is well aerated. A skin fold is noted over the lateral aspect of the left upper lobe. Persistent right-sided pleural effusion is seen. A PleurX catheter is noted as well as a right-sided chest tube. No pneumothorax is seen. Stable atelectatic changes are noted in the right base. IMPRESSION: No significant interval change from the prior exam. Two right-sided chest tubes are again seen. Electronically Signed   By: Inez Catalina M.D.   On: 08/19/2017 07:41   Dg Chest Portable 1 View  Result Date: 08/18/2017 CLINICAL DATA:  Chest tube placement. EXAM: PORTABLE CHEST 1 VIEW COMPARISON:  08/16/2017.  08/11/2017.  CT 08/14/2017. FINDINGS: Right chest tubes noted over the right chest. Mild improvement of right pleural effusion. No pneumothorax. Mediastinum and hilar structures normal. No acute bony abnormality. Atelectatic changes right lung base. Left lung is clear. Heart size normal. IMPRESSION: 1. Interim placement right chest tubes. Improvement of right pleural effusion. No pneumothorax. 2.  Atelectatic changes right lung base. Electronically Signed   By: Marcello Moores  Register   On: 08/18/2017 14:01   Dg C-arm 1-60 Min-no Report  Result Date: 08/18/2017 Fluoroscopy was utilized by the requesting physician.  No radiographic interpretation.    Assessment: 71 y.o. [place] woman  with a 33 P/Y smoking history and a large Right pleural effusion, cytology showing atypical cells that are ER negative, TTF1 positive  (1) history of Right breast cancer s/p lumpectomy 1998, followed by chemotherapy and radiation, all done at the [facility] in [place]-- the fact that she did not receive tamoxifen indicates that was an ER negative tumor  (2) she had a negative mammogram SEPT 2017 and a negative breast exam 08/14/2017; she had a colonoscopy under Dr Buford Dresser 08/08/2017 with negative pathology; she had a CT angio 08/13 which shows no obvious source outside the lung; she  also has no obvious symptoms of infection--that and the significant smoking history suggests this is cancer, likely a lung primary  (3) additional workup:  (a) CEA, CA 27-29 and CA 19-9 on 08/14/2017 all WNL  (b) right pleural Bx 08/18/2017 shows fibrous adipose tissue and chronic inflammation, no malignancy   Plan:  I gave Kelly Gay a copy of her cytology and pathology results. She understands proving a negative is difficult but at this point we do not have evidence that the cause of her left effusion is CA. She understands the sensitivity of cytology in this setting is about 50%, but 3 samples gets that to about 85% and of course the pleural Bx is persuasive.  Accordingly recommend:  (a) referral to pulmunology in [place] (b) request pleural fluid to be sent for cytology 2 more times (can be done through [facility])  I have not arranged for oncology follow up but if that is needed at some point she requests the [facility] group in [place].  Please let me know if I can be of further help   Chauncey Cruel, MD 08/20/2017  7:22 AM Medical Oncology and Hematology [facility] [address] [place],  [address] Tel. [phone]    Fax. [phone]

## 2017-08-20 NOTE — Progress Notes (Signed)
Patient tolerated well the draining of left pleurx catheter using the supplied PleurX Drainage Kit and yielded 5 mL clear thin reddish fluid. Patient now resting back in bed using I.S. At 750.

## 2017-08-20 NOTE — Progress Notes (Signed)
Discharge teaching complete. Meds, diet, activity, follow up appointments, incisional care and symptoms reviewed and all questions answered. Copy of instructions and prescription given to patient. Patient discharged home via wheelchair with husband.

## 2017-08-23 LAB — ANAEROBIC CULTURE

## 2017-08-25 ENCOUNTER — Other Ambulatory Visit: Payer: Self-pay | Admitting: *Deleted

## 2017-08-25 ENCOUNTER — Other Ambulatory Visit: Payer: Self-pay | Admitting: Oncology

## 2017-08-25 LAB — BODY FLUID CULTURE
Culture: NO GROWTH
Gram Stain: NONE SEEN

## 2017-09-04 ENCOUNTER — Other Ambulatory Visit (HOSPITAL_COMMUNITY): Payer: Self-pay | Admitting: Pulmonary Disease

## 2017-09-04 ENCOUNTER — Ambulatory Visit (HOSPITAL_COMMUNITY)
Admission: RE | Admit: 2017-09-04 | Discharge: 2017-09-04 | Disposition: A | Discharge status: Home / Self Care | Payer: Medicare Other | Source: Ambulatory Visit | Attending: Pulmonary Disease | Admitting: Pulmonary Disease

## 2017-09-04 ENCOUNTER — Other Ambulatory Visit: Payer: Self-pay | Admitting: Cardiothoracic Surgery

## 2017-09-04 DIAGNOSIS — J90 Pleural effusion, not elsewhere classified: Secondary | ICD-10-CM

## 2017-09-04 DIAGNOSIS — J449 Chronic obstructive pulmonary disease, unspecified: Secondary | ICD-10-CM | POA: Insufficient documentation

## 2017-09-04 DIAGNOSIS — J9811 Atelectasis: Secondary | ICD-10-CM | POA: Diagnosis not present

## 2017-09-05 ENCOUNTER — Other Ambulatory Visit (HOSPITAL_COMMUNITY): Payer: Self-pay | Admitting: Internal Medicine

## 2017-09-05 DIAGNOSIS — C3491 Malignant neoplasm of unspecified part of right bronchus or lung: Secondary | ICD-10-CM

## 2017-09-08 ENCOUNTER — Ambulatory Visit (INDEPENDENT_AMBULATORY_CARE_PROVIDER_SITE_OTHER): Payer: Medicare Other | Admitting: Physician Assistant

## 2017-09-08 ENCOUNTER — Ambulatory Visit
Admission: RE | Admit: 2017-09-08 | Discharge: 2017-09-08 | Disposition: A | Discharge status: Home / Self Care | Payer: Medicare Other | Source: Ambulatory Visit | Attending: Cardiothoracic Surgery | Admitting: Cardiothoracic Surgery

## 2017-09-08 ENCOUNTER — Encounter
Admission: RE | Admit: 2017-09-08 | Discharge: 2017-09-08 | Disposition: A | Discharge status: Home / Self Care | Payer: Medicare Other | Source: Ambulatory Visit | Attending: Internal Medicine | Admitting: Internal Medicine

## 2017-09-08 VITALS — BP 123/81 | HR 104 | Resp 16 | Ht 64.0 in | Wt 106.2 lb

## 2017-09-08 DIAGNOSIS — Z9689 Presence of other specified functional implants: Secondary | ICD-10-CM

## 2017-09-08 DIAGNOSIS — C3491 Malignant neoplasm of unspecified part of right bronchus or lung: Secondary | ICD-10-CM | POA: Insufficient documentation

## 2017-09-08 DIAGNOSIS — J910 Malignant pleural effusion: Secondary | ICD-10-CM | POA: Diagnosis not present

## 2017-09-08 DIAGNOSIS — J90 Pleural effusion, not elsewhere classified: Secondary | ICD-10-CM

## 2017-09-08 DIAGNOSIS — Z09 Encounter for follow-up examination after completed treatment for conditions other than malignant neoplasm: Secondary | ICD-10-CM | POA: Diagnosis not present

## 2017-09-08 LAB — GLUCOSE, CAPILLARY
GLUCOSE-CAPILLARY: 237 mg/dL — AB (ref 65–99)
Glucose-Capillary: 224 mg/dL — ABNORMAL HIGH (ref 65–99)

## 2017-09-08 NOTE — Progress Notes (Signed)
Subjective:     LASHE OLIVEIRA presents to the clinic for follow-up on her pleurx catheter.     Objective:    BP 123/81 (BP Location: Right Arm, Patient Position: Sitting, Cuff Size: Normal)   Pulse (!) 104   Resp 16   Ht 5\' 4"  (1.626 m)   Wt 48.2 kg (106 lb 3.2 oz)   SpO2 97% Comment: RA  BMI 18.23 kg/m    Cor: RRR, no murmur Pulm: CTA, diminished in the right lower lobe Abd: no tenderness Ext: no edema Incision: pleurx cath site clean and bandage replaced   CLINICAL DATA:  Right pleural effusion, chest tube  EXAM: CHEST  2 VIEW  COMPARISON:  09/04/2017  FINDINGS: PleurX drainage catheter remains in place with moderate right pleural effusion and right mid and lower lung atelectasis or infiltrate. No pneumothorax. Left lung is clear. Heart is normal size.  IMPRESSION: Stable moderate right effusion with right lower lobe atelectasis or infiltrate. No pneumothorax.   Electronically Signed   By: Rolm Baptise M.D.   On: 09/08/2017 12:31   Assessment:  Mrs. Ohalloran presents to the clinic today for evaluation of her Pleurx catheter insertion and chest x-ray. She does continue to have persistent right pleural effusion which is much improved from her x-ray when she was in the hospital. She does not have any more shortness of breath like she did previously. They have tried to drain her Pleurx catheter several times without any drainage since she was in the hospital. She would like to get her Pleurx catheter removed.   Plan:   Discussed the patient with Dr. Prescott Gum. He advised that she is to drain the Pleurx catheter once a week for 2 more weeks and then if there is no change set up for a Pleurx catheter removal in short stay. I will call the patient and related the plan. She is still having home health coming out to the house for dressing changes. She has the surface through October. She will have a follow-up appointment after the Pleurx catheters removed for  follow-up chest x-ray.   Nicholes Rough, PA-C

## 2017-09-10 ENCOUNTER — Encounter
Admission: RE | Admit: 2017-09-10 | Discharge: 2017-09-10 | Disposition: A | Discharge status: Home / Self Care | Payer: Medicare Other | Source: Ambulatory Visit | Attending: Internal Medicine | Admitting: Internal Medicine

## 2017-09-10 DIAGNOSIS — C3491 Malignant neoplasm of unspecified part of right bronchus or lung: Secondary | ICD-10-CM | POA: Insufficient documentation

## 2017-09-10 LAB — GLUCOSE, CAPILLARY: Glucose-Capillary: 55 mg/dL — ABNORMAL LOW (ref 65–99)

## 2017-09-10 MED ORDER — FLUDEOXYGLUCOSE F - 18 (FDG) INJECTION
13.1000 | Freq: Once | INTRAVENOUS | Status: AC | PRN
  Administered 2017-09-10: 13.1 via INTRAVENOUS

## 2017-09-12 ENCOUNTER — Ambulatory Visit (HOSPITAL_COMMUNITY): Payer: Medicare Other

## 2017-09-18 ENCOUNTER — Other Ambulatory Visit: Payer: Self-pay

## 2017-09-18 DIAGNOSIS — J90 Pleural effusion, not elsewhere classified: Secondary | ICD-10-CM

## 2017-09-23 ENCOUNTER — Encounter (HOSPITAL_COMMUNITY): Payer: Self-pay

## 2017-09-23 ENCOUNTER — Ambulatory Visit (HOSPITAL_COMMUNITY)
Admission: RE | Admit: 2017-09-23 | Discharge: 2017-09-23 | Disposition: A | Discharge status: Home / Self Care | Payer: Medicare Other | Source: Ambulatory Visit | Attending: Cardiothoracic Surgery | Admitting: Cardiothoracic Surgery

## 2017-09-23 ENCOUNTER — Encounter (HOSPITAL_COMMUNITY)
Admission: RE | Disposition: A | Discharge status: Home / Self Care | Payer: Self-pay | Source: Ambulatory Visit | Attending: Cardiothoracic Surgery

## 2017-09-23 DIAGNOSIS — Z4682 Encounter for fitting and adjustment of non-vascular catheter: Secondary | ICD-10-CM | POA: Diagnosis present

## 2017-09-23 DIAGNOSIS — J90 Pleural effusion, not elsewhere classified: Secondary | ICD-10-CM

## 2017-09-23 HISTORY — PX: REMOVAL OF PLEURAL DRAINAGE CATHETER: SHX5080

## 2017-09-23 SURGERY — REMOVAL, CLOSED DRAINAGE CATHETER SYSTEM, PLEURAL
Anesthesia: Monitor Anesthesia Care | Laterality: Right

## 2017-09-23 MED ORDER — LIDOCAINE HCL (PF) 1 % IJ SOLN
INTRAMUSCULAR | Status: DC | PRN
  Administered 2017-09-23: 5 mL

## 2017-09-23 MED ORDER — LIDOCAINE HCL (PF) 1 % IJ SOLN
INTRAMUSCULAR | Status: AC
  Filled 2017-09-23: qty 10

## 2017-09-23 NOTE — Progress Notes (Signed)
Patient presents today for right Pleur X removal. She last had the right Pleur x drained last Monday and it had little output. I attempted to drain right pleur X but there was scant output. Right pleur X catheter site was marked by me. I prepped and draped the area with Betadine and small drape. I then cut the suture. 1 % Xylocaine was used to achieve local anesthetic. The cuff was carefully dissected out. There was some resistance in pulling the catheter out initially and it was gradually pulled out.  There was minor bloody ooze. 2 Nylon sutures were placed. 4x4 with tape was then applied. Patient tolerated the procedure fairly well. She was instructed if she has any change in her breathing or shortness of breath, she is to call 911.She was instructed to remove the dressing in the am and she may shower. If there is any bloody ooze, she may apply a band aid and change daily;otherwise, she may leave open to the air. A follow up appointment with Dr. Prescott Gum has been arranged.

## 2017-09-23 NOTE — Progress Notes (Signed)
Patient ambulatory after Plurex catheter removal for discharge. Verbal instructions given to patient by Quincy Simmonds, PA-C.

## 2017-09-24 ENCOUNTER — Encounter (HOSPITAL_COMMUNITY): Payer: Self-pay | Admitting: Cardiothoracic Surgery

## 2017-10-14 ENCOUNTER — Other Ambulatory Visit: Payer: Self-pay | Admitting: Cardiothoracic Surgery

## 2017-10-14 DIAGNOSIS — J90 Pleural effusion, not elsewhere classified: Secondary | ICD-10-CM

## 2017-10-15 ENCOUNTER — Encounter: Payer: Self-pay | Admitting: Cardiothoracic Surgery

## 2017-10-15 ENCOUNTER — Ambulatory Visit (INDEPENDENT_AMBULATORY_CARE_PROVIDER_SITE_OTHER): Payer: Medicare Other | Admitting: Cardiothoracic Surgery

## 2017-10-15 ENCOUNTER — Ambulatory Visit
Admission: RE | Admit: 2017-10-15 | Discharge: 2017-10-15 | Disposition: A | Discharge status: Home / Self Care | Payer: Medicare Other | Source: Ambulatory Visit | Attending: Cardiothoracic Surgery | Admitting: Cardiothoracic Surgery

## 2017-10-15 VITALS — BP 102/68 | HR 105 | Ht 64.0 in | Wt 103.0 lb

## 2017-10-15 DIAGNOSIS — J910 Malignant pleural effusion: Secondary | ICD-10-CM | POA: Diagnosis not present

## 2017-10-15 DIAGNOSIS — J90 Pleural effusion, not elsewhere classified: Secondary | ICD-10-CM

## 2017-10-15 DIAGNOSIS — C342 Malignant neoplasm of middle lobe, bronchus or lung: Secondary | ICD-10-CM

## 2017-10-15 NOTE — Progress Notes (Signed)
PCP is [facility] Referring Provider is Velvet Bathe, MD  Chief Complaint  Patient presents with  . 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 After Pleurx catheter drainage became scant the catheter was removed Chest x-ray today shows no significant recurrent right pleural effusion. The patient still has a density in the right hilum from malignancy as well as segmental atelectasis of right lower lobe also probably from tumor. She complains of being short of breath but apparently had a CTA showing no evidence of pulmonary embolus. Previous CT-PET scan showed no evidence of pericardial effusion. She is being treated for anemia after having first round of chemotherapy recently Past Medical History:  Diagnosis Date  . Breast cancer ([place]) 1998   Right nipple inversion s/p lumpectomy/resection, chemo/rads, no recurrence  . Bronchitis   . Diabetes ([place])   . HTN (hypertension)   . Hyperlipidemia     Past Surgical History:  Procedure Laterality Date  . ABDOMINAL HYSTERECTOMY    . BREAST SURGERY     partial, right for cancer  . CHEST TUBE INSERTION Right 08/18/2017   Procedure: INSERTION PLEURAL DRAINAGE CATHETER;  Surgeon: Ivin Poot, MD;  Location: [facility];  Service: Thoracic;  Laterality: Right;  . COLON BIOPSY  2005    Dr. Gala Romney: normal  . COLONOSCOPY  2004   Dr. Gala Romney: multiple rectosigmoid polyps, sigmoid polyp with carcinoma in situ  . COLONOSCOPY N/A 07/18/2014   Procedure: COLONOSCOPY;  Surgeon: Daneil Dolin, MD;  [facility];  Service: Endoscopy;  Laterality: N/A;  10:00  . COLONOSCOPY N/A 08/08/2017   Procedure: COLONOSCOPY;  Surgeon: Daneil Dolin, MD;  [facility];  Service: Endoscopy;  Laterality: N/A;  9:30 AM  . ESOPHAGOGASTRODUODENOSCOPY N/A 07/18/2014    Procedure: ESOPHAGOGASTRODUODENOSCOPY (EGD);  Surgeon: Daneil Dolin, MD;  [facility];  Service: Endoscopy;  Laterality: N/A;  . REMOVAL OF PLEURAL DRAINAGE CATHETER Right 09/23/2017   Procedure: REMOVAL OF PLEURAL DRAINAGE CATHETER;  Surgeon: Ivin Poot, MD;  Location: [facility];  Service: Thoracic;  Laterality: Right;    Family History  Problem Relation Age of Onset  . Throat cancer Brother   . Lung cancer Brother   . Cancer Brother        ?prostate  . COPD Mother   . Heart attack Father   . Colon cancer Neg Hx     Social History Social History  Substance Use Topics  . Smoking status: Former Smoker    Packs/day: 1.00    Years: 50.00    Types: Cigarettes  . Smokeless tobacco: Never Used     Comment: quit in July 2018  . Alcohol use No    Current Outpatient Prescriptions  Medication Sig Dispense Refill  . buPROPion (WELLBUTRIN XL) 150 MG 24 hr tablet Take 150 mg by mouth daily with breakfast.     . cetirizine (ZYRTEC) 10 MG tablet Take 10 mg by mouth daily as needed for allergies.     Marland Kitchen glipiZIDE (GLUCOTROL) 5 MG tablet Take 5 mg by mouth daily with breakfast.    . hydrochlorothiazide (MICROZIDE) 12.5 MG capsule Take 12.5 mg by mouth daily with breakfast.     . ibuprofen (ADVIL,MOTRIN) 200 MG tablet Take 400 mg by mouth every 8 (eight) hours as needed for headache  or moderate pain.     Marland Kitchen losartan (COZAAR) 25 MG tablet Take 25 mg by mouth daily with breakfast.     . metFORMIN (GLUCOPHAGE) 1000 MG tablet Take 1,000 mg by mouth daily with breakfast.     . Multiple Vitamins-Minerals (CENTRUM ULTRA WOMENS) TABS Take 1 tablet by mouth daily.     . Omega-3 Fatty Acids (FISH OIL ULTRA) 1400 MG CAPS Take 1,400 mg by mouth daily.    . traMADol (ULTRAM) 50 MG tablet Take 1 tablet (50 mg total) by mouth every 6 (six) hours as needed for moderate pain. (Patient not taking: Reported on 09/18/2017) 30 tablet 0   No current facility-administered medications for this visit.      Allergies  Allergen Reactions  . Oxycodone Nausea And Vomiting  . Incruse Ellipta [Umeclidinium Bromide] Anxiety    Review of Systems  Weak Losing weight In a wheelchair today  BP 102/68   Pulse (!) 105   Ht 5\' 4"  (1.626 m)   Wt 103 lb (46.7 kg)   SpO2 96%   BMI 17.68 kg/m  Physical Exam     Physical Exam  Gen. appearance-we chronically ill and fragile in wheelchair   HEENT-pale  conjunctiva no cervical adenopathy   lungs-decreased breath sounds on the right    C OR-sinus tachycardia no murmur or rub   Diagnostic Tests: Chest x-ray done today personally reviewed showing no recurrent right pleural effusion after removal Pleurx catheter  Impression: Advanced carcinoma right lung with melena and pleural effusion Pleurx catheter removed earlier this month after scant drainage for several occasions. Catheter site has healed.  Plan:return as needed for anythoracic surgical issues   Len Childs, MD [facility] [phone]

## 2017-10-29 ENCOUNTER — Encounter (HOSPITAL_COMMUNITY): Payer: Self-pay

## 2018-01-13 ENCOUNTER — Other Ambulatory Visit (HOSPITAL_COMMUNITY): Payer: Self-pay | Admitting: Family Medicine

## 2018-01-13 DIAGNOSIS — Z1231 Encounter for screening mammogram for malignant neoplasm of breast: Secondary | ICD-10-CM

## 2018-01-22 ENCOUNTER — Ambulatory Visit (HOSPITAL_COMMUNITY)
Admission: RE | Admit: 2018-01-22 | Discharge: 2018-01-22 | Disposition: A | Discharge status: Home / Self Care | Payer: Medicare Other | Source: Ambulatory Visit | Attending: Family Medicine | Admitting: Family Medicine

## 2018-01-22 DIAGNOSIS — Z1231 Encounter for screening mammogram for malignant neoplasm of breast: Secondary | ICD-10-CM | POA: Diagnosis present

## 2018-01-27 ENCOUNTER — Other Ambulatory Visit (HOSPITAL_COMMUNITY): Payer: Self-pay | Admitting: Oncology

## 2018-01-27 DIAGNOSIS — Z09 Encounter for follow-up examination after completed treatment for conditions other than malignant neoplasm: Secondary | ICD-10-CM

## 2018-01-27 DIAGNOSIS — C3481 Malignant neoplasm of overlapping sites of right bronchus and lung: Secondary | ICD-10-CM

## 2018-02-05 ENCOUNTER — Encounter (HOSPITAL_COMMUNITY): Payer: Self-pay

## 2018-02-05 ENCOUNTER — Ambulatory Visit (HOSPITAL_COMMUNITY)
Admission: RE | Admit: 2018-02-05 | Discharge: 2018-02-05 | Disposition: A | Discharge status: Home / Self Care | Payer: Medicare Other | Source: Ambulatory Visit | Attending: Oncology | Admitting: Oncology

## 2018-02-05 DIAGNOSIS — I700 Atherosclerosis of aorta: Secondary | ICD-10-CM | POA: Diagnosis not present

## 2018-02-05 DIAGNOSIS — C3481 Malignant neoplasm of overlapping sites of right bronchus and lung: Secondary | ICD-10-CM | POA: Diagnosis present

## 2018-02-05 DIAGNOSIS — R918 Other nonspecific abnormal finding of lung field: Secondary | ICD-10-CM | POA: Diagnosis not present

## 2018-02-05 DIAGNOSIS — J439 Emphysema, unspecified: Secondary | ICD-10-CM | POA: Diagnosis not present

## 2018-02-05 DIAGNOSIS — M899 Disorder of bone, unspecified: Secondary | ICD-10-CM | POA: Diagnosis not present

## 2018-02-05 DIAGNOSIS — Z09 Encounter for follow-up examination after completed treatment for conditions other than malignant neoplasm: Secondary | ICD-10-CM

## 2018-02-05 LAB — GLUCOSE, CAPILLARY: GLUCOSE-CAPILLARY: 98 mg/dL (ref 65–99)

## 2018-02-05 MED ORDER — FLUDEOXYGLUCOSE F - 18 (FDG) INJECTION
5.9000 | Freq: Once | INTRAVENOUS | Status: AC
  Administered 2018-02-05: 5.9 via INTRAVENOUS

## 2018-05-01 ENCOUNTER — Other Ambulatory Visit (HOSPITAL_COMMUNITY): Payer: Self-pay | Admitting: Oncology

## 2018-05-01 DIAGNOSIS — C3481 Malignant neoplasm of overlapping sites of right bronchus and lung: Secondary | ICD-10-CM

## 2018-05-01 DIAGNOSIS — C3491 Malignant neoplasm of unspecified part of right bronchus or lung: Secondary | ICD-10-CM

## 2018-06-02 ENCOUNTER — Encounter (HOSPITAL_COMMUNITY)
Admission: RE | Admit: 2018-06-02 | Discharge: 2018-06-02 | Disposition: A | Discharge status: Home / Self Care | Payer: Medicare Other | Source: Ambulatory Visit | Attending: Oncology | Admitting: Oncology

## 2018-06-02 DIAGNOSIS — C3491 Malignant neoplasm of unspecified part of right bronchus or lung: Secondary | ICD-10-CM | POA: Diagnosis present

## 2018-06-02 DIAGNOSIS — C3481 Malignant neoplasm of overlapping sites of right bronchus and lung: Secondary | ICD-10-CM | POA: Diagnosis present

## 2018-06-02 LAB — GLUCOSE, CAPILLARY: Glucose-Capillary: 147 mg/dL — ABNORMAL HIGH (ref 65–99)

## 2018-06-02 MED ORDER — FLUDEOXYGLUCOSE F - 18 (FDG) INJECTION
5.9900 | Freq: Once | INTRAVENOUS | Status: AC | PRN
  Administered 2018-06-02: 5.99 via INTRAVENOUS

## 2019-01-04 ENCOUNTER — Other Ambulatory Visit (HOSPITAL_COMMUNITY): Payer: Self-pay | Admitting: Family Medicine

## 2019-01-04 DIAGNOSIS — Z1231 Encounter for screening mammogram for malignant neoplasm of breast: Secondary | ICD-10-CM

## 2019-01-25 ENCOUNTER — Ambulatory Visit (HOSPITAL_COMMUNITY): Payer: Medicare Other

## 2019-07-22 IMAGING — CT CT CHEST W/O CM
2 of 4 series · 15 of 36 positions shown, 18 images · non-contrast
Comparison: Chest x-ray August 11, 2017 and chest CT August 11, 2017

CLINICAL DATA: Post thoracentesis.  Shortness of breath.

EXAM:
CT CHEST WITHOUT CONTRAST
TECHNIQUE: Multidetector CT imaging of the chest was performed following the
standard protocol without IV contrast.

[Series 3: chest wo · axial · 0.69mm/px · z∈[-451,-153]mm · 12 of 177 slices shown, 15 images]
[im 14/177  mediastinal]
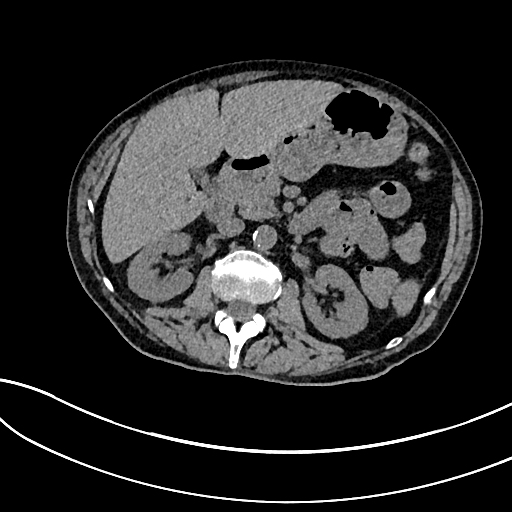
[im 14/177  lung]
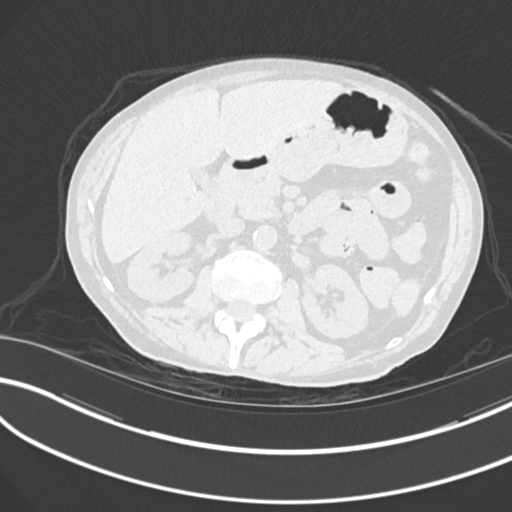
[im 28/177  lung]
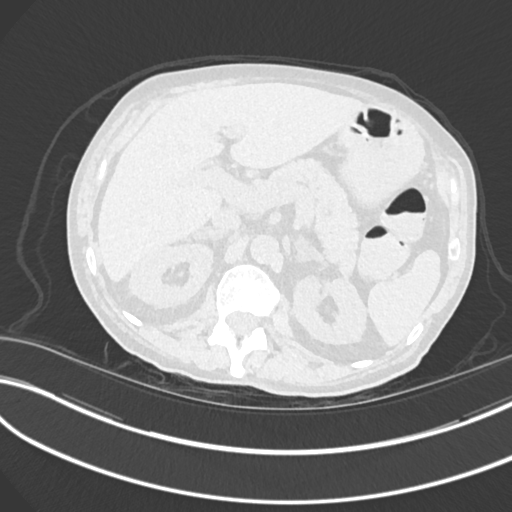
[im 41/177  lung]
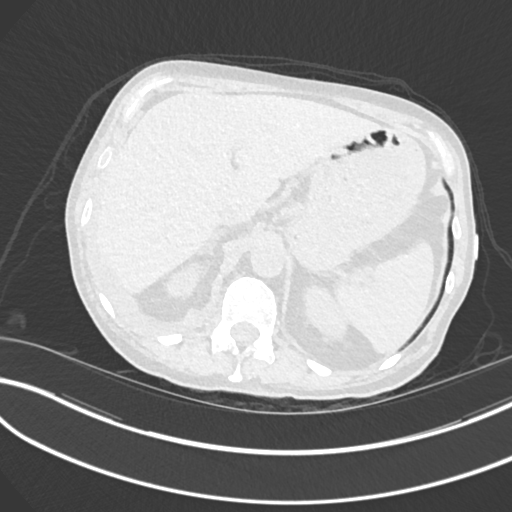
[im 55/177  lung]
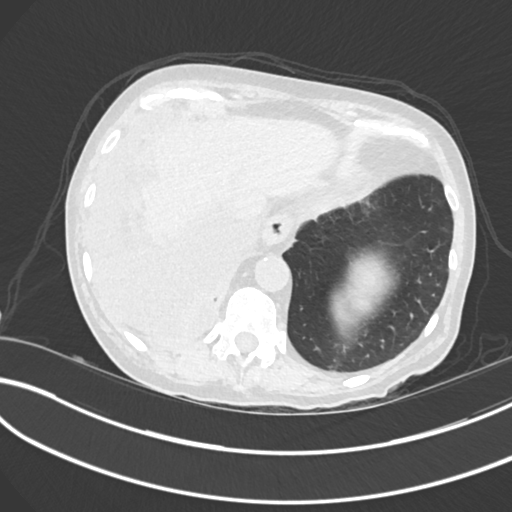
[im 68/177  mediastinal]
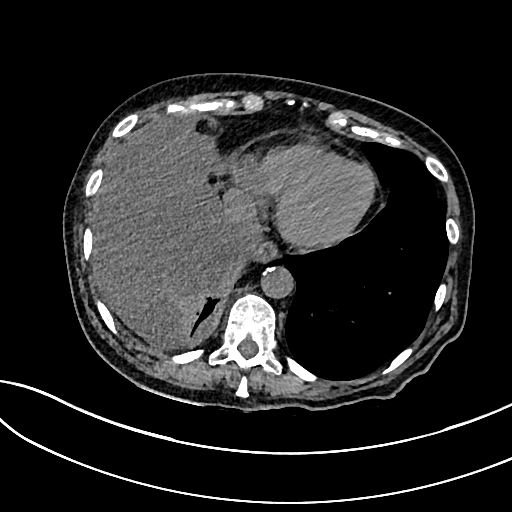
[im 68/177  lung]
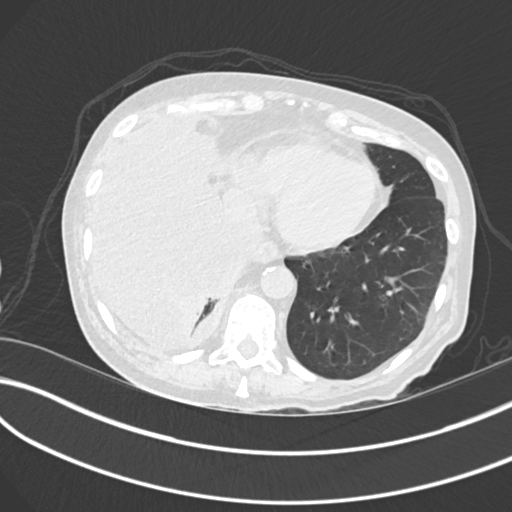
[im 82/177  lung]
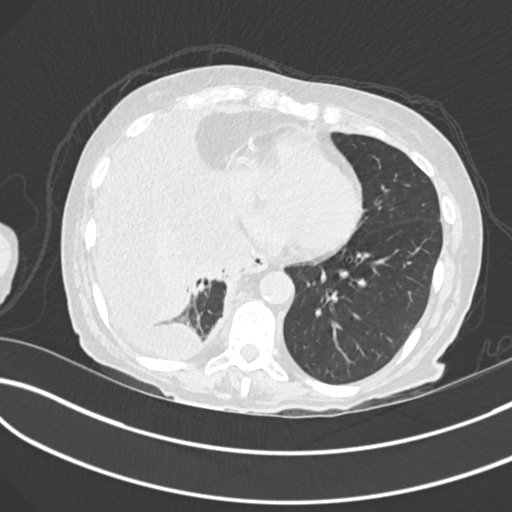
[im 95/177  lung]
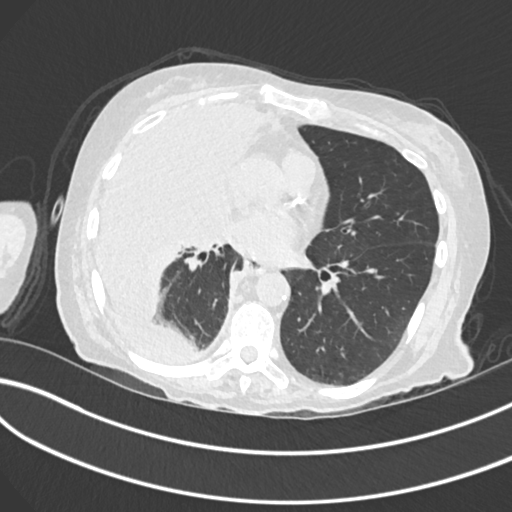
[im 109/177  lung]
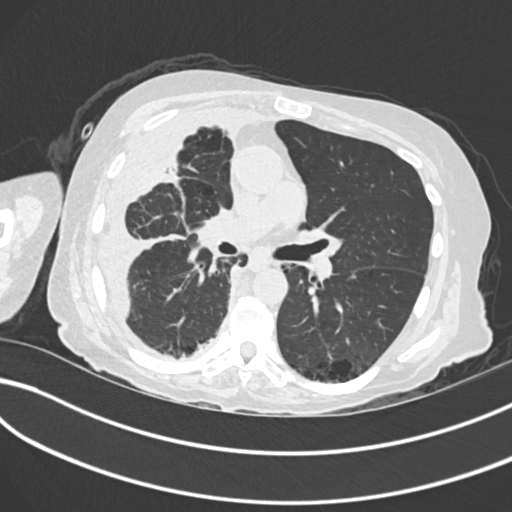
[im 122/177  mediastinal]
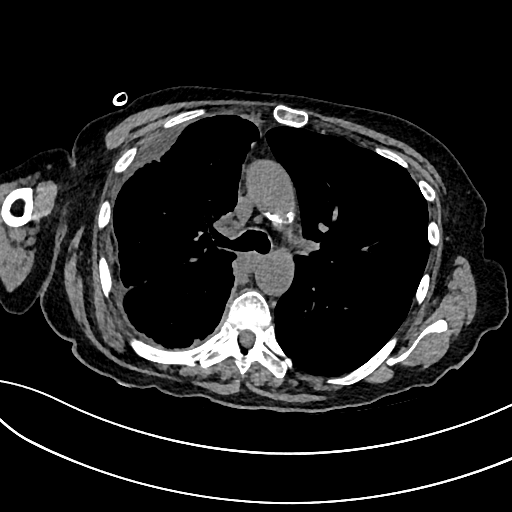
[im 122/177  lung]
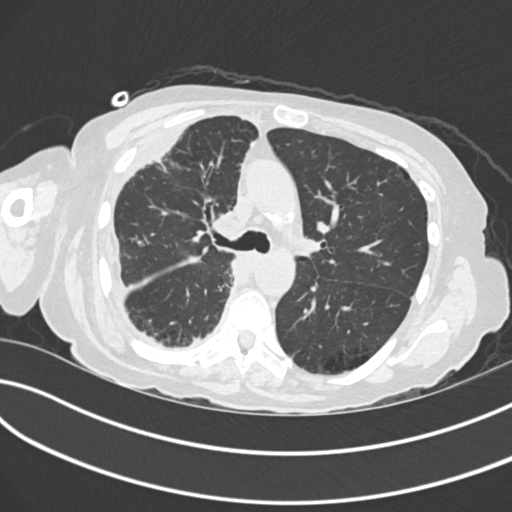
[im 136/177  lung]
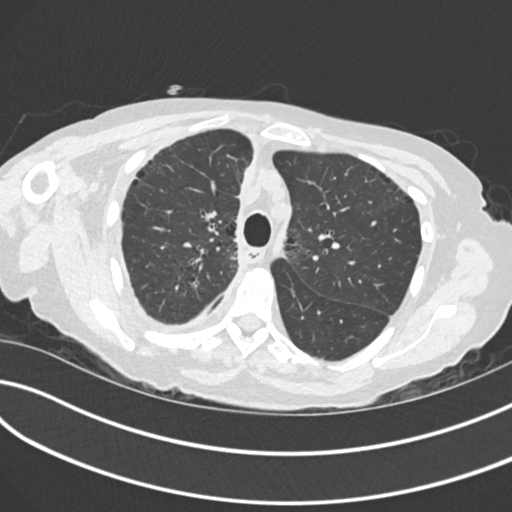
[im 149/177  lung]
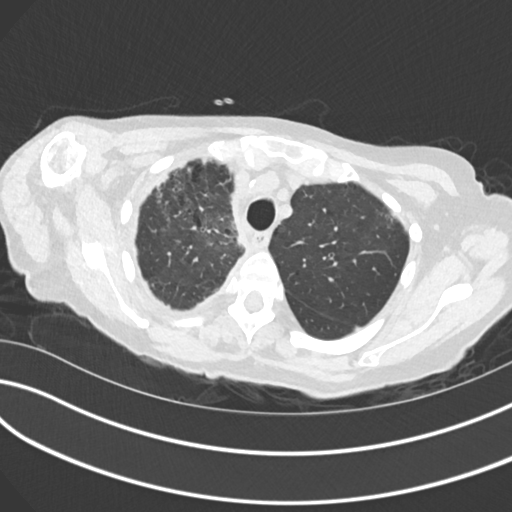
[im 163/177  lung]
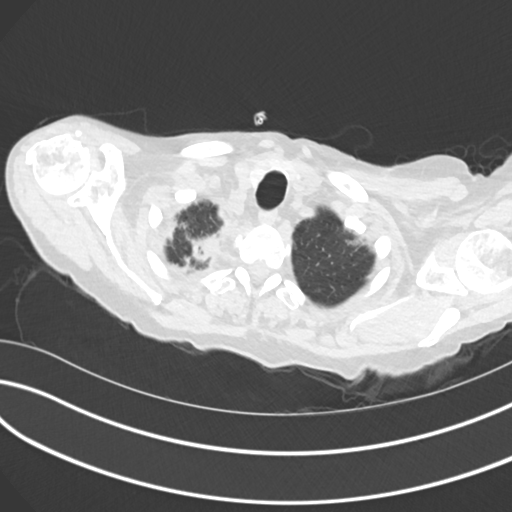

[Series 6: cor · coronal · 0.69mm/px · 3 of 128 slices shown]
[im 26/128  lung]
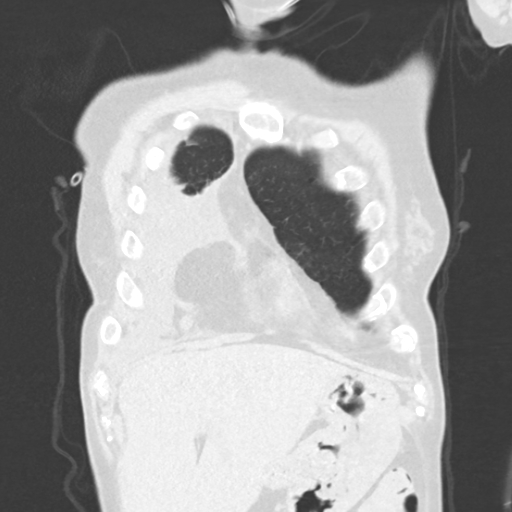
[im 51/128  lung]
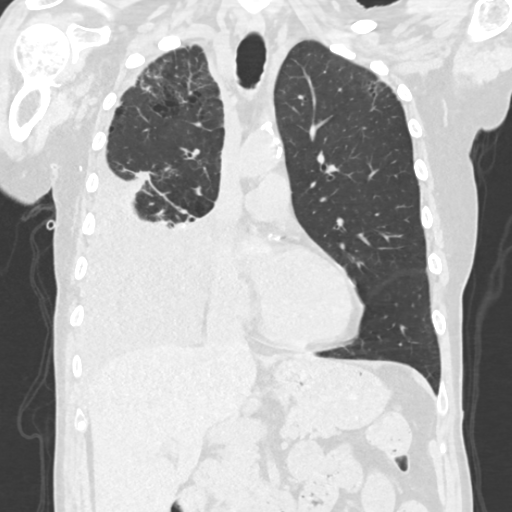
[im 77/128  lung]
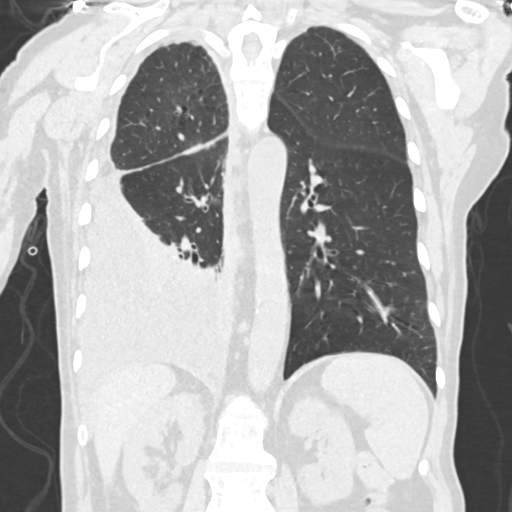

[15 of 36 positions shown; findings below may reference images not displayed]

FINDINGS: Cardiovascular: The thoracic aorta and central pulmonary arteries
are unremarkable in caliber. Atherosclerosis in the aorta. Coronary
artery calcifications are noted. No cardiomegaly.

Mediastinum/Nodes: Thyroid nodularity was described on the previous
study and is stable. A prominent precarinal node measures 11 mm on
series 3, image 60, similar in the interval. A small node in the
epicardial fat on image 112 is stable as well. The esophagus is
unremarkable.

Lungs/Pleura: The central airways are unremarkable. No pneumothorax.
The right-sided pleural effusion it is smaller in the interval but
does remain with loculated components, particularly at the base but
also laterally. There is also pleural thickening seen medially. The
pleural thickening is mildly nodular in contour in some locations.
Emphysematous changes are seen in the lungs. There is a small nodule
in the lateral left lung base on series 4, image 113 measuring 6 mm
and not seen on the recent comparison. No other suspicious findings
seen in the left lung. Mild opacity is seen in the right upper lobe
with a persistent nodule measuring 6 mm on image 32. Another tiny
nodule seen in the right upper lobe on image 47, not clearly
visualized previously, possibly due to the effusion. This nodule
measures 4 mm. A probable nodule seen in the right base on image 56.
Continued volume loss in the right middle lobe with improved
aeration. Continued volume loss in the right base as well, also
improved in the interval. Biapical pleuroparenchymal thickening is
identified.

Upper Abdomen: Thickening of the adrenal glands without discrete
nodularity suggests hyperplasia. No other acute abnormalities.

Musculoskeletal: No chest wall mass or suspicious bone lesions
identified.
IMPRESSION: 1. The patient is status post right thoracentesis. A moderate
right-sided pleural effusion remains with loculated components.
Pleural thickening, some of which is mildly nodular, persists on the
right. Recommend correlation with thoracentesis.
2. A few mildly prominent nodes are identified in the precarinal and
epicardial regions. See above for details.
3. Atherosclerotic change in the thoracic aorta. Coronary artery
calcifications.
4. Pulmonary nodularity as above with the largest nodule measuring 6
mm. Recommend attention on follow-up.

Aortic Atherosclerosis ([id number]-[id number]).

## 2019-08-31 DEATH — deceased
